# Patient Record
Sex: Male | Born: 1955 | Race: White | Hispanic: No | Marital: Married | Smoking: Former smoker
Health system: Southern US, Community
[De-identification: ages and names within clinical notes are randomized; demographics above are authoritative.]

## PROBLEM LIST (undated history)

## (undated) DIAGNOSIS — N400 Enlarged prostate without lower urinary tract symptoms: Secondary | ICD-10-CM

## (undated) DIAGNOSIS — I10 Essential (primary) hypertension: Secondary | ICD-10-CM

## (undated) DIAGNOSIS — I2510 Atherosclerotic heart disease of native coronary artery without angina pectoris: Secondary | ICD-10-CM

## (undated) DIAGNOSIS — E7800 Pure hypercholesterolemia, unspecified: Secondary | ICD-10-CM

## (undated) HISTORY — DX: Benign prostatic hyperplasia without lower urinary tract symptoms: N40.0

## (undated) HISTORY — DX: Atherosclerotic heart disease of native coronary artery without angina pectoris: I25.10

---
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
## 2000-06-27 ENCOUNTER — Emergency Department (HOSPITAL_COMMUNITY): Admission: EM | Admit: 2000-06-27 | Discharge: 2000-06-27 | Payer: Self-pay | Admitting: Emergency Medicine

## 2000-06-27 ENCOUNTER — Encounter: Payer: Self-pay | Admitting: Emergency Medicine

## 2000-08-16 ENCOUNTER — Ambulatory Visit (HOSPITAL_BASED_OUTPATIENT_CLINIC_OR_DEPARTMENT_OTHER): Admission: RE | Admit: 2000-08-16 | Discharge: 2000-08-16 | Payer: Self-pay | Admitting: Pulmonary Disease

## 2000-09-10 ENCOUNTER — Encounter: Admission: RE | Admit: 2000-09-10 | Discharge: 2000-12-09 | Payer: Self-pay | Admitting: Pulmonary Disease

## 2009-06-26 ENCOUNTER — Emergency Department (HOSPITAL_COMMUNITY): Admission: EM | Admit: 2009-06-26 | Discharge: 2009-06-26 | Payer: Self-pay | Admitting: Emergency Medicine

## 2010-05-29 ENCOUNTER — Encounter (INDEPENDENT_AMBULATORY_CARE_PROVIDER_SITE_OTHER): Payer: Self-pay | Admitting: *Deleted

## 2010-07-25 ENCOUNTER — Encounter (INDEPENDENT_AMBULATORY_CARE_PROVIDER_SITE_OTHER): Payer: Self-pay | Admitting: *Deleted

## 2010-07-26 ENCOUNTER — Ambulatory Visit: Payer: Self-pay | Admitting: Gastroenterology

## 2010-08-09 ENCOUNTER — Ambulatory Visit: Payer: Self-pay | Admitting: Gastroenterology

## 2010-08-11 ENCOUNTER — Encounter: Payer: Self-pay | Admitting: Gastroenterology

## 2010-11-02 NOTE — Letter (Signed)
Summary: Results Letter  [facility]  [address] [place], [place] [address]   Phone: [phone]  Fax: [phone]        August 11, 2010 MRN: [id number]    [facility] [address] [place], [place]  [address]    Dear Mr. Feldstein,   At least one of the polyps removed during your recent procedure was proven to be adenomatous.  These are pre-cancerous polyps that may have grown into cancers if they had not been removed.  Based on current nationally recognized surveillance guidelines, I recommend that you have a repeat colonoscopy in 5 years.  We will therefore put your information in our reminder system and will contact you in 5 years to schedule a repeat procedure.  Please call if you have any questions or concerns.       Sincerely,  Rachael Fee MD  This letter has been electronically signed by your physician.  Appended Document: Results Letter letter mailed

## 2010-11-02 NOTE — Letter (Signed)
Summary: [facility] Instructions  [facility]  [address] [place], [place] [address]   Phone: [phone]  Fax: [phone]       Manuel Williams    [birth year]    MRN: [id number]        Procedure Day Dorna Bloom:  [facility]  08/09/10     Arrival Time:  9:00AM     Procedure Time:  10:00AM     Location of Procedure:                    _X _  [facility] (4th Floor)                       PREPARATION FOR COLONOSCOPY WITH MOVIPREP   Starting 5 days prior to your procedure 08/04/10 do not eat nuts, seeds, popcorn, corn, beans, peas,  salads, or any raw vegetables.  Do not take any fiber supplements (e.g. Metamucil, Citrucel, and Benefiber).  THE DAY BEFORE YOUR PROCEDURE         DATE: 08/08/10   DAY: TUESDAY  1.  Drink clear liquids the entire day-NO SOLID FOOD  2.  Do not drink anything colored red or purple.  Avoid juices with pulp.  No orange juice.  3.  Drink at least 64 oz. (8 glasses) of fluid/clear liquids during the day to prevent dehydration and help the prep work efficiently.  CLEAR LIQUIDS INCLUDE: Water Jello Ice Popsicles Tea (sugar ok, no milk/cream) Powdered fruit flavored drinks Coffee (sugar ok, no milk/cream) Gatorade Juice: apple, white grape, white cranberry  Lemonade Clear bullion, consomm, broth Carbonated beverages (any kind) Strained chicken noodle soup Hard Candy                             4.  In the morning, mix first dose of MoviPrep solution:    Empty 1 Pouch A and 1 Pouch B into the disposable container    Add lukewarm drinking water to the top line of the container. Mix to dissolve    Refrigerate (mixed solution should be used within 24 hrs)  5.  Begin drinking the prep at 5:00 p.m. The MoviPrep container is divided by 4 marks.   Every 15 minutes drink the solution down to the next mark (approximately 8 oz) until the full liter is complete.   6.  Follow completed prep with 16 oz of clear liquid of your choice  (Nothing red or purple).  Continue to drink clear liquids until bedtime.  7.  Before going to bed, mix second dose of MoviPrep solution:    Empty 1 Pouch A and 1 Pouch B into the disposable container    Add lukewarm drinking water to the top line of the container. Mix to dissolve    Refrigerate  THE DAY OF YOUR PROCEDURE      DATE: 08/09/10   DAY: WEDNESDAY  Beginning at 5:00AM (5 hours before procedure):         1. Every 15 minutes, drink the solution down to the next mark (approx 8 oz) until the full liter is complete.  2. Follow completed prep with 16 oz. of clear liquid of your choice.    3. You may drink clear liquids until 8:00AM (2 HOURS BEFORE PROCEDURE).   MEDICATION INSTRUCTIONS  Unless otherwise instructed, you should take regular prescription medications with a small sip of water   as early as possible the  morning of your procedure.  Additional medication instructions: Hold Benicar/HCT the morning of procedure.         OTHER INSTRUCTIONS  You will need a responsible adult at least 56 years of age to accompany you and drive you home.   This person must remain in the waiting room during your procedure.  Wear loose fitting clothing that is easily removed.  Leave jewelry and other valuables at home.  However, you may wish to bring a book to read or  an iPod/MP3 player to listen to music as you wait for your procedure to start.  Remove all body piercing jewelry and leave at home.  Total time from sign-in until discharge is approximately 2-3 hours.  You should go home directly after your procedure and rest.  You can resume normal activities the  day after your procedure.  The day of your procedure you should not:   Drive   Make legal decisions   Operate machinery   Drink alcohol   Return to work  You will receive specific instructions about eating, activities and medications before you leave.    The above instructions have been reviewed and  explained to me by   Wyona Almas RN  July 26, 2010 4:10 PM     I fully understand and can verbalize these instructions _____________________________ Date _________

## 2010-11-02 NOTE — Letter (Signed)
Summary: Previsit letter  [facility]  [address] [place], [place] [address]   Phone: [phone]  Fax: [phone]       05/29/2010 MRN: [id number]  [facility] [address] [place], [place]  [address]  Dear Manuel Williams,  Welcome to the Gastroenterology Division at [facility].    You are scheduled to see a nurse for your pre-procedure visit on June 14, 2010 at 1:00pm on the 3rd floor at [facility], [address]  We ask that you try to arrive at our office 15 minutes prior to your appointment time to allow for check-in.  Your nurse visit will consist of discussing your medical and surgical history, your immediate family medical history, and your medications.    Please bring a complete list of all your medications or, if you prefer, bring the medication bottles and we will list them.  We will need to be aware of both prescribed and over the counter drugs.  We will need to know exact dosage information as well.  If you are on blood thinners (Coumadin, Plavix, Aggrenox, Ticlid, etc.) please call our office today/prior to your appointment, as we need to consult with your physician about holding your medication.   Please be prepared to read and sign documents such as consent forms, a financial agreement, and acknowledgement forms.  If necessary, and with your consent, a friend or relative is welcome to sit-in on the nurse visit with you.  Please bring your insurance card so that we may make a copy of it.  If your insurance requires a referral to see a specialist, please bring your referral form from your primary care physician.  No co-pay is required for this nurse visit.     If you cannot keep your appointment, please call [phone] to cancel or reschedule prior to your appointment date.  This allows [place] the opportunity to schedule an appointment for another patient in need of care.    Thank you for choosing [facility] for your  medical needs.  We appreciate the opportunity to care for you.  Please visit [place] at our website  to learn more about our practice.                     Sincerely.                                                                                                                   The Gastroenterology Division

## 2010-11-02 NOTE — Miscellaneous (Signed)
Summary: [facility] Previsit/prep  Clinical Lists Changes  Medications: Added new medication of MOVIPREP 100 GM  SOLR (PEG-KCL-NACL-NASULF-NA ASC-C) As per prep instructions. - Signed Rx of MOVIPREP 100 GM  SOLR (PEG-KCL-NACL-NASULF-NA ASC-C) As per prep instructions.;  #1 x 0;  Signed;  Entered by: Wyona Almas RN;  Authorized by: Rachael Fee MD;  Method used: Electronically to [facility]. [place]. [phone]*, [address], [place], [place], [place]  [address], Ph: [phone] or [phone], Fax: [phone] Observations: Added new observation of NKA: T (07/26/2010 15:33)    Prescriptions: MOVIPREP 100 GM  SOLR (PEG-KCL-NACL-NASULF-NA ASC-C) As per prep instructions.  #1 x 0   Entered by:   Wyona Almas RN   Authorized by:   Rachael Fee MD   Signed by:   Wyona Almas RN on 07/26/2010   Method used:   Electronically to        [facility]. [address] [phone]* (retail)       [address]       [place], [place]  [address]       Ph: [phone] or [phone]       Fax: [phone]   RxID:   [id number]

## 2010-11-02 NOTE — Procedures (Signed)
Summary: Colonoscopy  Patient: Manuel Williams Note: All result statuses are Final unless otherwise noted.  Tests: (1) Colonoscopy (COL)   COL Colonoscopy           DONE      [facility]     [address]     [place], [place]  [address]           COLONOSCOPY PROCEDURE REPORT           PATIENT:  [name], [name]  MR#:  [id number]     BIRTHDATE:  30-Jun-1956, 54 yrs. old  GENDER:  male     ENDOSCOPIST:  Rachael Fee, MD     REF. BY:  Tomi Bamberger, NP     PROCEDURE DATE:  08/09/2010     PROCEDURE:  Colonoscopy with biopsy and snare polypectomy     ASA CLASS:  Class II     INDICATIONS:  Routine Risk Screening     MEDICATIONS:   Fentanyl 50 mcg IV, Versed 9 mg IV           DESCRIPTION OF PROCEDURE:   After the risks benefits and     alternatives of the procedure were thoroughly explained, informed     consent was obtained.  Digital rectal exam was performed and     revealed no rectal masses.   The LB 180AL [id number] endoscope was     introduced through the anus and advanced to the cecum, which was     identified by both the appendix and ileocecal valve, without     limitations.  The quality of the prep was adequate, using     MoviPrep.  The instrument was then slowly withdrawn as the colon     was fully examined.     <<PROCEDUREIMAGES>>           FINDINGS:  Three sessile polyps were found, all were removed and     all were sent to pathology (jar 1). These ranged in size from 2mm     to 5mm across, were located in ascending and rectum segments. Two     were removed with cold snare, one with forceps (see image4 and     image7).  Mild diverticulosis was found in the sigmoid to     descending colon segments (see image5).  This was otherwise a     normal examination of the colon (see image6, image2, and image1).     Retroflexed views in the rectum revealed no abnormalities.    The     scope was then withdrawn from the patient and the procedure     completed.        COMPLICATIONS:  None     ENDOSCOPIC IMPRESSION:     1) Three subcentimeter polyps, all removed and all sent to     pathology     2) Mild diverticulosis in the sigmoid to descending colon     segments     3) Otherwise normal examination           RECOMMENDATIONS:     1) If the polyp(s) removed today are proven to be adenomatous     (pre-cancerous) polyps, you will need a colonoscopy in 3-5 years.     Otherwise you should continue to follow colorectal cancer     screening guidelines for "routine risk" patients with a     colonoscopy in 10 years.     2) You will receive a letter within 1-2 weeks with the  results     of your biopsy as well as final recommendations. Please call my     office if you have not received a letter after 3 weeks.           ______________________________     Rachael Fee, MD           n.     eSIGNED:   Rachael Fee at 08/09/2010 10:33 AM           Karleen Dolphin, [phone]  Note: An exclamation mark (!) indicates a result that was not dispersed into the flowsheet. Document Creation Date: 08/09/2010 10:33 AM _______________________________________________________________________  (1) Order result status: Final Collection or observation date-time: 08/09/2010 10:28 Requested date-time:  Receipt date-time:  Reported date-time:  Referring Physician:   Ordering Physician: Rob Bunting [phone]) Specimen Source:  Source: Launa Grill Order Number: [id number] Lab site:   Appended Document: Colonoscopy     Procedures Next Due Date:    Colonoscopy: 08/2015

## 2011-09-09 ENCOUNTER — Emergency Department (HOSPITAL_COMMUNITY): Payer: 59

## 2011-09-09 ENCOUNTER — Encounter: Payer: Self-pay | Admitting: *Deleted

## 2011-09-09 ENCOUNTER — Emergency Department (HOSPITAL_COMMUNITY)
Admission: EM | Admit: 2011-09-09 | Discharge: 2011-09-09 | Disposition: A | Discharge status: Home / Self Care | Payer: 59 | Attending: Emergency Medicine | Admitting: Emergency Medicine

## 2011-09-09 DIAGNOSIS — I10 Essential (primary) hypertension: Secondary | ICD-10-CM | POA: Insufficient documentation

## 2011-09-09 DIAGNOSIS — J111 Influenza due to unidentified influenza virus with other respiratory manifestations: Secondary | ICD-10-CM | POA: Insufficient documentation

## 2011-09-09 DIAGNOSIS — F17200 Nicotine dependence, unspecified, uncomplicated: Secondary | ICD-10-CM | POA: Insufficient documentation

## 2011-09-09 HISTORY — DX: Essential (primary) hypertension: I10

## 2011-09-09 MED ORDER — OSELTAMIVIR PHOSPHATE 75 MG PO CAPS: 75.0000 mg | ORAL_CAPSULE | Freq: Two times a day (BID) | ORAL | Status: AC

## 2011-09-09 NOTE — ED Notes (Signed)
Pt c/o cough and congestion since Friday. Also c/o headache. Denies fever.

## 2011-09-10 NOTE — ED Provider Notes (Signed)
History     CSN: [id number] Arrival date & time: 09/09/2011 10:19 AM   First MD Initiated Contact with Patient 09/09/11 1040      Chief Complaint  Patient presents with  . Cough    (Consider location/radiation/quality/duration/timing/severity/associated sxs/prior treatment) Patient is a 55 y.o. male presenting with cough. The history is provided by the patient.  Cough This is a new problem. The current episode started 2 days ago. The problem occurs every few minutes. The problem has been gradually worsening. The cough is non-productive. There has been no fever. Associated symptoms include chills, sweats, headaches and myalgias. Pertinent negatives include no chest pain, no sore throat, no shortness of breath and no wheezing. He has tried cough syrup (also tried Tylenol cold without relief) for the symptoms. He is a smoker. His past medical history does not include pneumonia or asthma.    Past Medical History  Diagnosis Date  . Hypertension     History reviewed. No pertinent past surgical history.  History reviewed. No pertinent family history.  History  Substance Use Topics  . Smoking status: Current Everyday Smoker -- 0.5 packs/day  . Smokeless tobacco: Not on file  . Alcohol Use: No      Review of Systems  Constitutional: Positive for chills. Negative for fever.  HENT: Negative for congestion, sore throat and neck pain.   Eyes: Negative.   Respiratory: Positive for cough. Negative for chest tightness, shortness of breath and wheezing.   Cardiovascular: Negative for chest pain.  Gastrointestinal: Negative for nausea and abdominal pain.  Genitourinary: Negative.   Musculoskeletal: Positive for myalgias. Negative for joint swelling and arthralgias.  Skin: Negative.  Negative for rash and wound.  Neurological: Positive for headaches. Negative for dizziness, weakness, light-headedness and numbness.  Hematological: Negative.   Psychiatric/Behavioral: Negative.      Allergies  Review of patient's allergies indicates no known allergies.  Home Medications   Current Outpatient Rx  Name Route Sig Dispense Refill  . GUAIFENESIN 100 MG/5ML PO SOLN Oral Take 15 mLs by mouth every 4 (four) hours as needed. Cough     . OLMESARTAN MEDOXOMIL-HCTZ 20-12.5 MG PO TABS Oral Take 1 tablet by mouth daily.      Marland Kitchen PHENYLEPHRINE-DM-GG-APAP 5-10-200-325 MG/15ML PO LIQD Oral Take 15 mLs by mouth daily as needed. Cough     . OSELTAMIVIR PHOSPHATE 75 MG PO CAPS Oral Take 1 capsule (75 mg total) by mouth 2 (two) times daily. 10 capsule 0    BP 145/61  Pulse 86  Temp(Src) 99.1 F (37.3 C) (Oral)  Resp 18  Ht 6' (1.829 m)  Wt 255 lb (115.667 kg)  BMI 34.58 kg/m2  SpO2 98%  Physical Exam  Nursing note and vitals reviewed. Constitutional: He is oriented to person, place, and time. He appears well-developed and well-nourished.  HENT:  Head: Normocephalic and atraumatic.  Eyes: Conjunctivae are normal.  Neck: Normal range of motion. Neck supple.  Cardiovascular: Normal rate, regular rhythm, normal heart sounds and intact distal pulses.   Pulmonary/Chest: Effort normal and breath sounds normal. He has no wheezes. He has no rales. He exhibits no tenderness.  Abdominal: Soft. Bowel sounds are normal. There is no tenderness.  Musculoskeletal: Normal range of motion.  Neurological: He is alert and oriented to person, place, and time.  Skin: Skin is warm and dry.  Psychiatric: He has a normal mood and affect.    ED Course  Procedures (including critical care time)  Labs Reviewed - No data to  display Dg Chest 2 View  09/09/2011  *RADIOLOGY REPORT*  Clinical Data: Cough and chest congestion.  CHEST - 2 VIEW  Comparison:  None.  Findings:  The heart size and mediastinal contours are within normal limits.  Both lungs are clear.  The visualized skeletal structures are unremarkable.  IMPRESSION: No active cardiopulmonary disease.  Original Report Authenticated By: Danae Orleans, M.D.     1. Influenza       MDM  The patient appears reasonably screened and/or stabilized for discharge and I doubt any other medical condition or other [facility] requiring further screening, evaluation, or treatment in the ED at this time prior to discharge.         Candis Musa, PA 09/10/11 2128

## 2011-09-11 NOTE — ED Provider Notes (Signed)
Medical screening examination/treatment/procedure(s) were performed by non-physician practitioner and as supervising physician I was immediately available for consultation/collaboration.   Shelda Jakes, MD 09/11/11 [phone]

## 2012-03-01 ENCOUNTER — Encounter (HOSPITAL_COMMUNITY): Payer: Self-pay | Admitting: Emergency Medicine

## 2012-03-01 ENCOUNTER — Emergency Department (HOSPITAL_COMMUNITY)
Admission: EM | Admit: 2012-03-01 | Discharge: 2012-03-01 | Disposition: A | Discharge status: Home / Self Care | Payer: 59 | Attending: Emergency Medicine | Admitting: Emergency Medicine

## 2012-03-01 DIAGNOSIS — S30860A Insect bite (nonvenomous) of lower back and pelvis, initial encounter: Secondary | ICD-10-CM | POA: Insufficient documentation

## 2012-03-01 DIAGNOSIS — I10 Essential (primary) hypertension: Secondary | ICD-10-CM | POA: Insufficient documentation

## 2012-03-01 DIAGNOSIS — F17200 Nicotine dependence, unspecified, uncomplicated: Secondary | ICD-10-CM | POA: Insufficient documentation

## 2012-03-01 DIAGNOSIS — W57XXXA Bitten or stung by nonvenomous insect and other nonvenomous arthropods, initial encounter: Secondary | ICD-10-CM | POA: Insufficient documentation

## 2012-03-01 MED ORDER — DOXYCYCLINE HYCLATE 100 MG PO CAPS: 100.0000 mg | ORAL_CAPSULE | Freq: Two times a day (BID) | ORAL | Status: AC

## 2012-03-01 NOTE — ED Notes (Signed)
Pt states he took a tick off of his rt lower back this am and wants to have it checked out.

## 2012-03-01 NOTE — Discharge Instructions (Signed)
Wood Tick Bite Ticks are insects that attach themselves to the skin. Most tick bites are harmless, but sometimes ticks carry diseases that can make a person quite ill. The chance of getting ill depends on:  The kind of tick that bites you.   Time of year.   How long the tick is attached.   Geographic location.  Wood ticks are also called dog ticks. They are generally black. They can have white markings. They live in shrubs and grassy areas. They are larger than deer ticks. Wood ticks are about the size of a watermelon seed. They have a hard body. The most common places for ticks to attach themselves are the scalp, neck, armpits, waist, and groin. Wood ticks may stay attached for up to 2 weeks. TICKS MUST BE REMOVED AS SOON AS POSSIBLE TO HELP PREVENT DISEASES CAUSED BY TICK BITES.  TO REMOVE A TICK: 1. If available, put on latex gloves before trying to remove a tick.  2. Grasp the tick as close to the skin as possible, with curved forceps, fine tweezers or a special tick removal tool.  3. Pull gently with steady pressure until the tick lets go. Do not twist the tick or jerk it suddenly. This may break off the tick's head or mouth parts.  4. Do not crush the tick's body. This could force disease-carrying fluids from the tick into your body.  5. After the tick is removed, wash the bite area and your hands with soap and water or other disinfectant.  6. Apply a small amount of antiseptic cream or ointment to the bite site.  7. Wash and disinfect any instruments that were used.  8. Save the tick in a jar or plastic bag for later identification. Preserve the tick with a bit of alcohol or put it in the freezer.  9. Do not apply a hot match, petroleum jelly, or fingernail polish to the tick. This does not work and may increase the chances of disease from the tick bite.  YOU MAY NEED TO SEE YOUR CAREGIVER FOR A TETANUS SHOT NOW IF:  You have no idea when you had the last one.   You have never had a  tetanus shot before.  If you need a tetanus shot, and you decide not to get one, there is a rare chance of getting tetanus. Sickness from tetanus can be serious. If you get a tetanus shot, your arm may swell, get red and warm to the touch at the shot site. This is common and not a problem. PREVENTION  Wear protective clothing. Long sleeves and pants are best.   Wear white clothes to see ticks more easily   Tuck your pant legs into your socks.   If walking on trail, stay in the middle of the trail to avoid brushing against bushes.   Put insect repellent on all exposed skin and along boot tops, pant legs and sleeve cuffs   Check clothing, hair and skin repeatedly and before coming inside.   Brush off any ticks that are not attached.  SEEK MEDICAL CARE IF:   You cannot remove a tick or part of the tick that is left in the skin.   Unexplained fever.   Redness and swelling in the area of the tick bite.   Tender, swollen lymph glands.   Diarrhea.   Weight loss.   Cough.   Fatigue.   Muscle, joint or bone pain.   Belly pain.   Headache.   Rash.    SEEK IMMEDIATE MEDICAL CARE IF:   You develop an oral temperature above 102 F (38.9 C).   You are having trouble walking or moving your legs.   Numbness in the legs.   Shortness of breath.   Confusion.   Repeated vomiting.  Document Released: 09/14/2000 Document Revised: 09/06/2011 Document Reviewed: 08/23/2008 [employer] Patient Information 2012 [employer]. 

## 2012-03-01 NOTE — ED Notes (Signed)
Pt presents with tick bite on rt lower back. Site is raised, red with small raised "bumps" surrounding the bite site.  Tick was removed about an hour ago. Pt denies known fever, joint and/or body aches.

## 2012-03-02 NOTE — ED Provider Notes (Signed)
History     CSN: [id number]  Arrival date & time 03/01/12  1136   First MD Initiated Contact with Patient 03/01/12 1159      Chief Complaint  Patient presents with  . Tick Removal    (Consider location/radiation/quality/duration/timing/severity/associated sxs/prior treatment) HPI Comments: Manuel Williams took a tick off his right flank which had been present for at least the past 2 days,  At first believing it to be a mole.  It was somewhat engorged when he removed it today. He has a raised,  Reddened nodule along with several surrounding raised nodules which are itchy and becoming increasing reddened with spreading to the surrounding skin.  He denies any other rash,  Also denies fever, headache, nausea,  myalgias or joint pain and swelling.    The history is provided by the patient.    Past Medical History  Diagnosis Date  . Hypertension     History reviewed. No pertinent past surgical history.  History reviewed. No pertinent family history.  History  Substance Use Topics  . Smoking status: Current Everyday Smoker -- 0.5 packs/day    Types: Cigarettes  . Smokeless tobacco: Not on file  . Alcohol Use: No      Review of Systems  Constitutional: Negative for fever and chills.  HENT: Negative for facial swelling.   Respiratory: Negative for shortness of breath and wheezing.   Skin: Positive for rash and wound.  Neurological: Negative for numbness.    Allergies  Review of patient's allergies indicates no known allergies.  Home Medications   Current Outpatient Rx  Name Route Sig Dispense Refill  . DOXYCYCLINE HYCLATE 100 MG PO CAPS Oral Take 1 capsule (100 mg total) by mouth 2 (two) times daily. 20 capsule 0  . GUAIFENESIN 100 MG/5ML PO SOLN Oral Take 15 mLs by mouth every 4 (four) hours as needed. Cough     . OLMESARTAN MEDOXOMIL-HCTZ 20-12.5 MG PO TABS Oral Take 1 tablet by mouth daily.      Marland Kitchen PHENYLEPHRINE-DM-GG-APAP 5-10-200-325 MG/15ML PO LIQD Oral Take 15 mLs  by mouth daily as needed. Cough       BP 122/61  Pulse 65  Temp(Src) 97.5 F (36.4 C) (Oral)  Resp 20  Ht 6\' 1"  (1.854 m)  Wt 258 lb (117.028 kg)  BMI 34.04 kg/m2  SpO2 98%  Physical Exam  Constitutional: He appears well-developed and well-nourished. No distress.  HENT:  Head: Normocephalic.  Neck: Neck supple.  Cardiovascular: Normal rate.   Pulmonary/Chest: Effort normal. He has no wheezes.  Musculoskeletal: Normal range of motion. He exhibits no edema.  Skin: Rash noted. Rash is papular. There is erythema.       One central raised papule on right flank,  approx 0.4 cm and indurated without fluctuance or drainage,  3 satellite papules 1/2 the size of the central lesion with erythema surrounding the entire patch of lesions,  No central clearing of erythema.    ED Course  Procedures (including critical care time)  Labs Reviewed - No data to display No results found.   1. Tick bite of back       MDM  Not classic bulls eye lesion,  But with significant tick exposure,  Pt covered with doxycycline x 10 days.  Encouraged recheck by pcp or return here if not improving,  Or if sx progress beyond the next 48 hours.  Recommended benadryl for itch.        Burgess Amor, [place] 03/02/12 2203

## 2012-03-03 NOTE — ED Provider Notes (Signed)
Medical screening examination/treatment/procedure(s) were performed by non-physician practitioner and as supervising physician I was immediately available for consultation/collaboration.   Dayton Bailiff, MD 03/03/12 [phone]

## 2014-12-05 ENCOUNTER — Emergency Department (HOSPITAL_COMMUNITY)
Admission: EM | Admit: 2014-12-05 | Discharge: 2014-12-05 | Disposition: A | Discharge status: Home / Self Care | Payer: 59 | Attending: Emergency Medicine | Admitting: Emergency Medicine

## 2014-12-05 ENCOUNTER — Encounter (HOSPITAL_COMMUNITY): Payer: Self-pay | Admitting: Emergency Medicine

## 2014-12-05 ENCOUNTER — Emergency Department (HOSPITAL_COMMUNITY): Payer: 59

## 2014-12-05 DIAGNOSIS — Y939 Activity, unspecified: Secondary | ICD-10-CM | POA: Insufficient documentation

## 2014-12-05 DIAGNOSIS — Z7982 Long term (current) use of aspirin: Secondary | ICD-10-CM | POA: Insufficient documentation

## 2014-12-05 DIAGNOSIS — Y929 Unspecified place or not applicable: Secondary | ICD-10-CM | POA: Insufficient documentation

## 2014-12-05 DIAGNOSIS — Z79899 Other long term (current) drug therapy: Secondary | ICD-10-CM | POA: Insufficient documentation

## 2014-12-05 DIAGNOSIS — S29012A Strain of muscle and tendon of back wall of thorax, initial encounter: Secondary | ICD-10-CM

## 2014-12-05 DIAGNOSIS — N400 Enlarged prostate without lower urinary tract symptoms: Secondary | ICD-10-CM | POA: Insufficient documentation

## 2014-12-05 DIAGNOSIS — X58XXXA Exposure to other specified factors, initial encounter: Secondary | ICD-10-CM | POA: Diagnosis not present

## 2014-12-05 DIAGNOSIS — Y999 Unspecified external cause status: Secondary | ICD-10-CM | POA: Insufficient documentation

## 2014-12-05 DIAGNOSIS — I10 Essential (primary) hypertension: Secondary | ICD-10-CM | POA: Diagnosis not present

## 2014-12-05 DIAGNOSIS — M79662 Pain in left lower leg: Secondary | ICD-10-CM | POA: Diagnosis not present

## 2014-12-05 DIAGNOSIS — M25512 Pain in left shoulder: Secondary | ICD-10-CM | POA: Diagnosis present

## 2014-12-05 DIAGNOSIS — Z720 Tobacco use: Secondary | ICD-10-CM | POA: Diagnosis not present

## 2014-12-05 HISTORY — DX: Benign prostatic hyperplasia without lower urinary tract symptoms: N40.0

## 2014-12-05 LAB — CBC WITH DIFFERENTIAL/PLATELET
Basophils Absolute: 0 10*3/uL (ref 0.0–0.1)
Basophils Relative: 0 % (ref 0–1)
Eosinophils Absolute: 0.4 10*3/uL (ref 0.0–0.7)
Eosinophils Relative: 6 % — ABNORMAL HIGH (ref 0–5)
HCT: 49.1 % (ref 39.0–52.0)
Hemoglobin: 16.4 g/dL (ref 13.0–17.0)
Lymphocytes Relative: 27 % (ref 12–46)
Lymphs Abs: 1.9 10*3/uL (ref 0.7–4.0)
MCH: 31 pg (ref 26.0–34.0)
MCHC: 33.4 g/dL (ref 30.0–36.0)
MCV: 92.8 fL (ref 78.0–100.0)
MONOS PCT: 8 % (ref 3–12)
Monocytes Absolute: 0.6 10*3/uL (ref 0.1–1.0)
NEUTROS ABS: 4.1 10*3/uL (ref 1.7–7.7)
NEUTROS PCT: 59 % (ref 43–77)
Platelets: 154 10*3/uL (ref 150–400)
RBC: 5.29 MIL/uL (ref 4.22–5.81)
RDW: 12.5 % (ref 11.5–15.5)
WBC: 7 10*3/uL (ref 4.0–10.5)

## 2014-12-05 LAB — BASIC METABOLIC PANEL
Anion gap: 6 (ref 5–15)
BUN: 20 mg/dL (ref 6–23)
CO2: 26 mmol/L (ref 19–32)
Calcium: 9.3 mg/dL (ref 8.4–10.5)
Chloride: 106 mmol/L (ref 96–112)
Creatinine, Ser: 0.88 mg/dL (ref 0.50–1.35)
GFR calc Af Amer: >90 mL/min (ref >90)
GFR calc non Af Amer: >90 mL/min (ref >90)
GLUCOSE: 124 mg/dL — AB (ref 70–99)
Potassium: 4.2 mmol/L (ref 3.5–5.1)
SODIUM: 138 mmol/L (ref 135–145)

## 2014-12-05 MED ORDER — PREDNISONE 10 MG PO TABS
ORAL_TABLET | ORAL | Status: DC
Start: 2014-12-05 — End: 2015-10-25

## 2014-12-05 MED ORDER — CYCLOBENZAPRINE HCL 5 MG PO TABS: 5.0000 mg | ORAL_TABLET | Freq: Three times a day (TID) | ORAL | Status: DC | PRN

## 2014-12-05 NOTE — ED Notes (Signed)
PT c/o bilateral shoulder pain and stiffness x2 weeks with no injury. PT also c/o left lower extremity swelling that started yesterday. PT denies any SOB or chest pain.

## 2014-12-05 NOTE — Discharge Instructions (Signed)
Muscle Strain A muscle strain is an injury that occurs when a muscle is stretched beyond its normal length. Usually a small number of muscle fibers are torn when this happens. Muscle strain is rated in degrees. First-degree strains have the least amount of muscle fiber tearing and pain. Second-degree and third-degree strains have increasingly more tearing and pain.  Usually, recovery from muscle strain takes 1-2 weeks. Complete healing takes 5-6 weeks.  CAUSES  Muscle strain happens when a sudden, violent force placed on a muscle stretches it too far. This may occur with lifting, sports, or a fall.  RISK FACTORS Muscle strain is especially common in athletes.  SIGNS AND SYMPTOMS At the site of the muscle strain, there may be:  Pain.  Bruising.  Swelling.  Difficulty using the muscle due to pain or lack of normal function. DIAGNOSIS  Your health care provider will perform a physical exam and ask about your medical history. TREATMENT  Often, the best treatment for a muscle strain is resting, icing, and applying cold compresses to the injured area.  HOME CARE INSTRUCTIONS   Use the PRICE method of treatment to promote muscle healing during the first 2-3 days after your injury. The PRICE method involves:  Protecting the muscle from being injured again.  Restricting your activity and resting the injured body part.  Icing your injury. To do this, put ice in a plastic bag. Place a towel between your skin and the bag. Then, apply the ice and leave it on from 15-20 minutes each hour. After the third day, switch to moist heat packs.  Apply compression to the injured area with a splint or elastic bandage. Be careful not to wrap it too tightly. This may interfere with blood circulation or increase swelling.  Elevate the injured body part above the level of your heart as often as you can.  Only take over-the-counter or prescription medicines for pain, discomfort, or fever as directed by your  health care provider.  Warming up prior to exercise helps to prevent future muscle strains. SEEK MEDICAL CARE IF:   You have increasing pain or swelling in the injured area.  You have numbness, tingling, or a significant loss of strength in the injured area. MAKE SURE YOU:   Understand these instructions.  Will watch your condition.  Will get help right away if you are not doing well or get worse. Document Released: 09/17/2005 Document Revised: 07/08/2013 Document Reviewed: 04/16/2013 [facility] Patient Information 2015 [place], [place]. This information is not intended to replace advice given to you by your health care provider. Make sure you discuss any questions you have with your health care provider.  Heat Therapy Heat therapy can help make painful, stiff muscles and joints feel better. Do not use heat on new injuries. Wait at least 48 hours after an injury to use heat. Do not use heat when you have aches or pains right after an activity. If you still have pain 3 hours after stopping the activity, then you may use heat. HOME CARE Wet heat pack  Soak a clean towel in warm water. Squeeze out the extra water.  Put the warm, wet towel in a plastic bag.  Place a thin, dry towel between your skin and the bag.  Put the heat pack on the area for 5 minutes, and check your skin. Your skin may be pink, but it should not be red.  Leave the heat pack on the area for 15 to 30 minutes.  Repeat this every  2 to 4 hours while awake. Do not use heat while you are sleeping. Warm water bath  Fill a tub with warm water.  Place the affected body part in the tub.  Soak the area for 20 to 40 minutes.  Repeat as needed. Hot water bottle  Fill the water bottle half full with hot water.  Press out the extra air. Close the cap tightly.  Place a dry towel between your skin and the bottle.  Put the bottle on the area for 5 minutes, and check your skin. Your skin may be pink, but it should not  be red.  Leave the bottle on the area for 15 to 30 minutes.  Repeat this every 2 to 4 hours while awake. Electric heating pad  Place a dry towel between your skin and the heating pad.  Set the heating pad on low heat.  Put the heating pad on the area for 10 minutes, and check your skin. Your skin may be pink, but it should not be red.  Leave the heating pad on the area for 20 to 40 minutes.  Repeat this every 2 to 4 hours while awake.  Do not lie on the heating pad.  Do not fall asleep while using the heating pad.  Do not use the heating pad near water. GET HELP RIGHT AWAY IF:  You get blisters or red skin.  Your skin is puffy (swollen), or you lose feeling (numbness) in the affected area.  You have any new problems.  Your problems are getting worse.  You have any questions or concerns. If you have any problems, stop using heat therapy until you see your doctor. MAKE SURE YOU:  Understand these instructions.  Will watch your condition.  Will get help right away if you are not doing well or get worse. Document Released: 12/10/2011 Document Reviewed: 11/10/2013 [facility] Patient Information 2015 [place]. This information is not intended to replace advice given to you by your health care provider. Make sure you discuss any questions you have with your health care provider.   Use the medicines prescribed.  Use caution with flexeril as this muscle relaxer may make you sleepy.

## 2014-12-05 NOTE — ED Provider Notes (Signed)
CSN: [id number]     Arrival date & time 12/05/14  4270 History   First MD Initiated Contact with Patient 12/05/14 0930     Chief Complaint  Patient presents with  . Shoulder Pain     (Consider location/radiation/quality/duration/timing/severity/associated sxs/prior Treatment) The history is provided by the patient.   Manuel Williams is a 59 y.o. male with a h/o hypertension with 2 complaints, the first being a 2 week history of left lower calf mild "soreness" (denies pain) along with swelling of his calf noticed yesterday. He denies any injuries, also no fevers, rashes, changes in skin coloration, denies recent extended bed rest or inactivity.  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 He reports wearing new work boots that are not comfortable.  He also has bilateral upper back and shoulder blade pain which has also been present for the past 2 weeks and is triggered only by movement, again denies injury.  He denies sob, cough, chest pain.  His shoulder pain is resolved at rest and not pleuritic.  He has tried no medications or treatments for symptom relief.        Past Medical History  Diagnosis Date  . Hypertension   . Prostate enlargement    History reviewed. No pertinent past surgical history. No family history on file. History  Substance Use Topics  . Smoking status: Current Every Day Smoker -- 0.50 packs/day    Types: Cigarettes  . Smokeless tobacco: Not on file  . Alcohol Use: No    Review of Systems  Constitutional: Negative for fever and chills.  HENT: Negative.  Negative for congestion and sore throat.   Eyes: Negative.   Respiratory: Negative for cough, chest tightness, shortness of breath and wheezing.   Cardiovascular: Negative for chest pain.  Gastrointestinal: Negative for nausea and abdominal pain.  Genitourinary: Negative.   Musculoskeletal: Positive for arthralgias. Negative for joint swelling and neck pain.  Skin: Negative.  Negative for  rash and wound.  Neurological: Negative for dizziness, weakness, light-headedness, numbness and headaches.  Psychiatric/Behavioral: Negative.       Allergies  Review of patient's allergies indicates no known allergies.  Home Medications   Prior to Admission medications   Medication Sig Start Date End Date Taking? Authorizing Provider  aspirin EC 81 MG tablet Take 81 mg by mouth daily.   Yes Historical Provider, MD  fluticasone (CUTIVATE) 0.05 % cream Apply 1 application topically daily as needed. 10/13/14  Yes Historical Provider, MD  olmesartan-hydrochlorothiazide (BENICAR HCT) 20-12.5 MG per tablet Take 1 tablet by mouth daily.     Yes Historical Provider, MD  tamsulosin (FLOMAX) 0.4 MG CAPS capsule Take 1 capsule by mouth daily. 11/08/14  Yes Historical Provider, MD  cyclobenzaprine (FLEXERIL) 5 MG tablet Take 1 tablet (5 mg total) by mouth 3 (three) times daily as needed for muscle spasms. 12/05/14   Evalee Jefferson, PA-C  predniSONE (DELTASONE) 10 MG tablet 6, 5, 4, 3, 2 then 1 tablet by mouth daily for 6 days total. 12/05/14   Evalee Jefferson, PA-C   BP 130/81 mmHg  Pulse 59  Temp(Src) 97.6 F (36.4 C) (Oral)  Resp 18  Ht 6' (1.829 m)  Wt 265 lb (120.203 kg)  BMI 35.93 kg/m2  SpO2 100% Physical Exam  Constitutional: He appears well-developed and well-nourished.  HENT:  Head: Atraumatic.  Neck: Normal range of motion.  Cardiovascular: Normal rate, regular rhythm and normal heart sounds.   Pulses:      Dorsalis  pedis pulses are 2+ on the right side, and 2+ on the left side.  Pulses equal bilaterally.  Less than 2 sec cap refill in left toes.  No varicosities in lower left leg.  Pulmonary/Chest: Breath sounds normal. No respiratory distress. He has no wheezes. He has no rhonchi. He has no rales.  Musculoskeletal: He exhibits no tenderness.  Left calf appears slightly fuller than right visually, measured at 1 cm greater than the right.  No peripheral edema, pedal pulses intact and equal.    Negative Homan's TTP across bilateral upper back including scapulas,  No cervical ttp.  Neurological: He is alert. He has normal strength. He displays normal reflexes. No sensory deficit.  Skin: Skin is warm and dry. No rash noted. No pallor.  Psychiatric: He has a normal mood and affect.    ED Course  Procedures (including critical care time) Labs Review Labs Reviewed  BASIC METABOLIC PANEL - Abnormal; Notable for the following:    Glucose, Bld 124 (*)    All other components within normal limits  CBC WITH DIFFERENTIAL/PLATELET - Abnormal; Notable for the following:    Eosinophils Relative 6 (*)    All other components within normal limits    Imaging Review Dg Chest 2 View  12/05/2014   CLINICAL DATA:  BILATERAL SHOULDER PAIN X 2 WEEKS, NO INJURY. PATIENT STATES " BOTH SHOULDERS FEEL SORE WHEN HE RAISES HIS ARMS" SWELLING IN LEFT LEG IN CALF AREA, PATIENT NOTICED THE SWELLING LAST NIGHT. HISTORY OF HTN. CURRENT EVERYDAY SMOKER, 1 PK A DAY  EXAM: CHEST  2 VIEW  COMPARISON:  09/09/2011.  FINDINGS: Emphysema. Cardiopericardial silhouette within normal limits. Mediastinal contours normal. Trachea midline. No airspace disease or effusion. The extreme RIGHT costophrenic angle is excluded from view on the frontal. Thoracic spine DISH. Stable configuration of the thoracic spine. Mild exaggerated kyphosis.  IMPRESSION: Emphysema without acute cardiopulmonary disease.   Electronically Signed   By: Dereck Ligas M.D.   On: 12/05/2014 12:39   US Venous Img Lower Unilateral Left  12/05/2014   CLINICAL DATA:  Left lower extremity swelling and pain for 1 day with shortness of breath.  EXAM: LEFT LOWER EXTREMITY VENOUS DOPPLER ULTRASOUND  TECHNIQUE: Gray-scale sonography with graded compression, as well as color Doppler and duplex ultrasound were performed to evaluate the lower extremity deep venous systems from the level of the common femoral vein and including the common femoral, femoral, profunda femoral,  popliteal and calf veins including the posterior tibial, peroneal and gastrocnemius veins when visible. The superficial great saphenous vein was also interrogated. Spectral Doppler was utilized to evaluate flow at rest and with distal augmentation maneuvers in the common femoral, femoral and popliteal veins.  COMPARISON:  None.  FINDINGS: Contralateral Common Femoral Vein: Respiratory phasicity is normal and symmetric with the symptomatic side. No evidence of thrombus. Normal compressibility.  Common Femoral Vein: No evidence of thrombus. Normal compressibility, respiratory phasicity and response to augmentation.  Saphenofemoral Junction: No evidence of thrombus. Normal compressibility and flow on color Doppler imaging.  Profunda Femoral Vein: No evidence of thrombus. Normal compressibility and flow on color Doppler imaging.  Femoral Vein: No evidence of thrombus. Normal compressibility, respiratory phasicity and response to augmentation.  Popliteal Vein: No evidence of thrombus. Normal compressibility, respiratory phasicity and response to augmentation.  Calf Veins: No evidence of thrombus. Normal compressibility and flow on color Doppler imaging.  Superficial Great Saphenous Vein: No evidence of thrombus. Normal compressibility and flow on color Doppler imaging.  Venous  Reflux:  None.  Other Findings:  None.  IMPRESSION: No evidence of deep venous thrombosis.   Electronically Signed   By: Abigail Miyamoto M.D.   On: 12/05/2014 12:26     EKG Interpretation None      MDM   Final diagnoses:  Upper back strain, initial encounter  Calf pain, left    Patients labs and/or radiological studies were reviewed and considered during the medical decision making and disposition process.  Results were also discussed with patient.  Upper back pain reproducible with movement and palpation.  Suspect musculoskeletal strain.  [place] negative for dvt in [place].  Pt denies pain at this time.  Unclear source of sx, vascular  intact. No visible trauma, no h/o trauma. No varicosities.  No ankle edema.  Leg sx also possibly muscular strain related.  He was advised heat, elevation, flexeril, prednisone.  F/u with pcp in one week if sx persist.    Evalee Jefferson, PA-C 12/05/14 [place], DO 12/07/14 2337

## 2015-03-13 ENCOUNTER — Encounter (HOSPITAL_COMMUNITY): Payer: Self-pay

## 2015-03-13 ENCOUNTER — Emergency Department (HOSPITAL_COMMUNITY)
Admission: EM | Admit: 2015-03-13 | Discharge: 2015-03-13 | Disposition: A | Discharge status: Home / Self Care | Payer: 59 | Attending: Emergency Medicine | Admitting: Emergency Medicine

## 2015-03-13 DIAGNOSIS — H01001 Unspecified blepharitis right upper eyelid: Secondary | ICD-10-CM | POA: Insufficient documentation

## 2015-03-13 DIAGNOSIS — H578 Other specified disorders of eye and adnexa: Secondary | ICD-10-CM | POA: Diagnosis present

## 2015-03-13 DIAGNOSIS — H01003 Unspecified blepharitis right eye, unspecified eyelid: Secondary | ICD-10-CM

## 2015-03-13 DIAGNOSIS — Z7982 Long term (current) use of aspirin: Secondary | ICD-10-CM | POA: Diagnosis not present

## 2015-03-13 DIAGNOSIS — Z720 Tobacco use: Secondary | ICD-10-CM | POA: Insufficient documentation

## 2015-03-13 DIAGNOSIS — H01002 Unspecified blepharitis right lower eyelid: Secondary | ICD-10-CM | POA: Diagnosis not present

## 2015-03-13 DIAGNOSIS — H01004 Unspecified blepharitis left upper eyelid: Secondary | ICD-10-CM | POA: Insufficient documentation

## 2015-03-13 DIAGNOSIS — N400 Enlarged prostate without lower urinary tract symptoms: Secondary | ICD-10-CM | POA: Insufficient documentation

## 2015-03-13 DIAGNOSIS — H01006 Unspecified blepharitis left eye, unspecified eyelid: Secondary | ICD-10-CM

## 2015-03-13 DIAGNOSIS — H01005 Unspecified blepharitis left lower eyelid: Secondary | ICD-10-CM | POA: Insufficient documentation

## 2015-03-13 DIAGNOSIS — Z7952 Long term (current) use of systemic steroids: Secondary | ICD-10-CM | POA: Insufficient documentation

## 2015-03-13 DIAGNOSIS — I10 Essential (primary) hypertension: Secondary | ICD-10-CM | POA: Diagnosis not present

## 2015-03-13 DIAGNOSIS — Z79899 Other long term (current) drug therapy: Secondary | ICD-10-CM | POA: Insufficient documentation

## 2015-03-13 DIAGNOSIS — H5789 Other specified disorders of eye and adnexa: Secondary | ICD-10-CM

## 2015-03-13 MED ORDER — CLINDAMYCIN HCL 150 MG PO CAPS: 300.0000 mg | ORAL_CAPSULE | Freq: Three times a day (TID) | ORAL | Status: DC

## 2015-03-13 MED ORDER — FLUORESCEIN SODIUM 1 MG OP STRP
ORAL_STRIP | OPHTHALMIC | Status: AC
  Filled 2015-03-13: qty 1

## 2015-03-13 MED ORDER — FLUORESCEIN SODIUM 1 MG OP STRP
1.0000 | ORAL_STRIP | Freq: Once | OPHTHALMIC | Status: AC
  Administered 2015-03-13: 1 via OPHTHALMIC

## 2015-03-13 MED ORDER — FLUORESCEIN SODIUM 1 MG OP STRP
1.0000 | ORAL_STRIP | Freq: Once | OPHTHALMIC | Status: AC
  Administered 2015-03-13: 1 via OPHTHALMIC
  Filled 2015-03-13: qty 1

## 2015-03-13 MED ORDER — TETRACAINE HCL 0.5 % OP SOLN
2.0000 [drp] | Freq: Once | OPHTHALMIC | Status: AC
  Administered 2015-03-13: 2 [drp] via OPHTHALMIC
  Filled 2015-03-13: qty 2

## 2015-03-13 NOTE — ED Provider Notes (Signed)
CSN: [id number]     Arrival date & time 03/13/15  1451 History   None   This chart was scribed for non-physician practitioner, Jamse Mead, [place], working with Pamella Pert, MD by Terressa Koyanagi, ED Scribe. This patient was seen in room TR04C/TR04C and the patient's care was started at 5:35 PM.  Chief Complaint  Patient presents with  . Eye Problem   The history is provided by the patient. No language interpreter was used.   PCP: Dr. Delia Chimes  HPI Comments: Manuel Williams is a 59 y.o. male, with Hx of HTN, prostate enlargement and daily tobacco use (0.5 ppd),  who presents to the Emergency Department complaining of ongoing, atraumatic, constant left and right eye redness, itching, drainage, soreness and burning onset 3 weeks ago. Pt also complains of associated blurred vision; swelling of the eyes bilaterally; photophobia; and waking up with both eyes matted shut over the past couple of days. Pt reports he was seen by an opthamologist regarding the same approximately 2 weeks ago and was started on Neomycin, polymyxin B sulfate, and desxamethasone ointment. The meds afforded no relief, thus, pt contacted his opthamologist and was Rx the same meds in a suspension, again with no relief. Pt reports he has also been using OTC pink eye meds in conjunction with the meds prescribed by the opthamologist without relief. Pt reports he is UTD on his tetanus vaccine noting that his last tetanus shot was 2 years ago. Denied loss of vision, fever, chills, neck pain, neck stiffness, cough, nasal congestion, contact or eyeglass use, possibility of foreign object to the eyes recently, recent travels, chest pain, SOB, sore throat, difficulty breathing, dizziness, headaches.  PCP none   Past Medical History  Diagnosis Date  . Hypertension   . Prostate enlargement    History reviewed. No pertinent past surgical history. History reviewed. No pertinent family history. History  Substance Use Topics  . Smoking  status: Current Every Day Smoker -- 0.50 packs/day    Types: Cigarettes  . Smokeless tobacco: Not on file  . Alcohol Use: No    Review of Systems  Constitutional: Negative for fever and chills.  HENT: Negative for congestion and sore throat.   Eyes: Positive for photophobia, discharge, redness, itching and visual disturbance. Negative for pain.  Respiratory: Negative for cough and shortness of breath.   Cardiovascular: Negative for chest pain.  Musculoskeletal: Negative for neck pain and neck stiffness.  Neurological: Negative for dizziness and headaches.   Allergies  Review of patient's allergies indicates no known allergies.  Home Medications   Prior to Admission medications   Medication Sig Start Date End Date Taking? Authorizing Provider  aspirin EC 81 MG tablet Take 81 mg by mouth daily.    Historical Provider, MD  clindamycin (CLEOCIN) 150 MG capsule Take 2 capsules (300 mg total) by mouth 3 (three) times daily. 03/13/15   Jaidalyn Schillo, PA-C  cyclobenzaprine (FLEXERIL) 5 MG tablet Take 1 tablet (5 mg total) by mouth 3 (three) times daily as needed for muscle spasms. 12/05/14   Evalee Jefferson, PA-C  fluticasone (CUTIVATE) 0.05 % cream Apply 1 application topically daily as needed. 10/13/14   Historical Provider, MD  olmesartan-hydrochlorothiazide (BENICAR HCT) 20-12.5 MG per tablet Take 1 tablet by mouth daily.      Historical Provider, MD  predniSONE (DELTASONE) 10 MG tablet 6, 5, 4, 3, 2 then 1 tablet by mouth daily for 6 days total. 12/05/14   Evalee Jefferson, PA-C  tamsulosin [facility]) 0.4  MG CAPS capsule Take 1 capsule by mouth daily. 11/08/14   Historical Provider, MD   Triage Vitals: BP 138/68 mmHg  Pulse 64  Temp(Src) 97.7 F (36.5 C) (Oral)  Resp 20  Ht 6\' 1"  (1.854 m)  Wt 278 lb 12.8 oz (126.463 kg)  BMI 36.79 kg/m2  SpO2 95% Physical Exam  Constitutional: He is oriented to person, place, and time. He appears well-developed and well-nourished. No distress.  HENT:  Head:  Normocephalic and atraumatic.  Mouth/Throat: Oropharynx is clear and moist. No oropharyngeal exudate.  Eyes: EOM are normal. Pupils are equal, round, and reactive to light. Right eye exhibits discharge. Right eye exhibits no chemosis, no exudate and no hordeolum. No foreign body present in the right eye. Left eye exhibits discharge. Left eye exhibits no chemosis, no exudate and no hordeolum. No foreign body present in the left eye. Right conjunctiva is not injected. Right conjunctiva has no hemorrhage. Left conjunctiva is not injected. Left conjunctiva has no hemorrhage. Right eye exhibits normal extraocular motion and no nystagmus. Left eye exhibits normal extraocular motion and no nystagmus.  Fundoscopic exam:      The right eye shows no arteriolar narrowing, no AV nicking, no exudate, no hemorrhage and no papilledema.       The left eye shows no arteriolar narrowing, no AV nicking, no exudate, no hemorrhage and no papilledema.  Slit lamp exam:      The right eye shows no corneal abrasion, no corneal flare, no corneal ulcer, no foreign body, no hyphema and no hypopyon.       The left eye shows no corneal abrasion, no corneal flare, no corneal ulcer, no foreign body, no hyphema and no hypopyon.  Redness and flaking of the skin identified to the upper and lower eyelids. Peeling of the skin identified, appears irritated. Negative active drainage or bleeding noted.  Neck: Normal range of motion. Neck supple.  Cardiovascular: Normal rate, regular rhythm and normal heart sounds.  Exam reveals no friction rub.   No murmur heard. Pulmonary/Chest: Effort normal and breath sounds normal. No respiratory distress. He has no wheezes. He has no rales.  Musculoskeletal: Normal range of motion.  Neurological: He is alert and oriented to person, place, and time. No cranial nerve deficit. He exhibits normal muscle tone. Coordination normal.  Skin: Skin is warm. He is not diaphoretic.  Psychiatric: He has a normal  mood and affect. His behavior is normal. Thought content normal.  Nursing note and vitals reviewed.   ED Course  Procedures (including critical care time) DIAGNOSTIC STUDIES: Oxygen Saturation is 95% on RA, adequate by my interpretation.    COORDINATION OF CARE: 5:47 PM-Discussed treatment plan which includes fluorescent eye test with pt at bedside and pt agreed to plan.   Labs Review Labs Reviewed - No data to display  Imaging Review No results found.   EKG Interpretation None         Visual Acuity  Right Eye Distance: 50 Left Eye Distance: 50 Bilateral Distance: 40  Right Eye Near: R Near: 20 Left Eye Near:  L Near: 20 Bilateral Near:  20   6:09 PM Patient seen and assessed by attending physician, Dr. Pamella Pert. As per attending physician recommended antibiotics, clindamycin 300 mg 3 times a day. Recommended baby oil and soft soap to aid and soothing of the skin irritation.  MDM   Final diagnoses:  Blepharitis of both eyes  Eye irritation    Medications  fluorescein ophthalmic strip 1  strip (1 strip Both Eyes Given 03/13/15 1736)  tetracaine (PONTOCAINE) 0.5 % ophthalmic solution 2 drop (2 drops Right Eye Given 03/13/15 1736)  fluorescein ophthalmic strip 1 strip (1 strip Both Eyes Given 03/13/15 1750)    Filed Vitals:   03/13/15 1458  BP: 138/68  Pulse: 64  Temp: 97.7 F (36.5 C)  TempSrc: Oral  Resp: 20  Height: 6\' 1"  (1.854 m)  Weight: 278 lb 12.8 oz (126.463 kg)  SpO2: 95%   I personally performed the services described in this documentation, which was scribed in my presence. The recorded information has been reviewed and is accurate.   Patient presented to the ED with eye irritation that has been ongoing for approximately 3 weeks. Patient is been seen and assessed by his ophthalmologist and started on antibiotics ointments and drops have not been helping. Patient reports that he has been having irritation, dryness and flaking of the skin  around his eyes and a burning sensation. Patient reported that he has been having drainage coming from the inner aspect of his eyes mainly the thick mucus. Denied eye pain. Mild swelling erythema and flaking of the skin identified to the upper and lower eyelids and outer aspects of the eyelids. Tenderness upon palpation to this region secondary to irritation. EOMs intact. Thick mucus production identified to the medial canthus bilaterally. Negative findings of foreign body. Negative ecchymosis. Negative findings of anterior chamber obstruction or irritation. Doubt herpetic keratitis. Doubt shingles. Visual acuity unremarkable. Patient seen and assessed by attending physician. Recommended treatment for blepharitis. Patient stable, afebrile. Patient not septic appearing. Negative signs of respiratory distress. Discharged patient. Discussed with patient to rest and stay hydrated. Discussed with patient to use baby oil and cool compressions. Discussed with patient to take antibiotics as prescribed. Referred patient to health and [facility] and ophthalmologist. Discussed with patient to closely monitor symptoms and if symptoms are to worsen or change to report back to the ED - strict return instructions given.  Patient agreed to plan of care, understood, all questions answered.   Jamse Mead, PA-C 03/13/15 [place], PA-C 03/13/15 1851  Pamella Pert, MD 03/14/15 1224

## 2015-03-13 NOTE — Discharge Instructions (Signed)
Please call your doctor for a followup appointment within 24-48 hours. When you talk to your doctor please let them know that you were seen in the emergency department and have them acquire all of your records so that they can discuss the findings with you and formulate a treatment plan to fully care for your new and ongoing problems. Please follow-up with health and wellness Center Please follow-up with ophthalmologist Please rest and stay hydrated Please take antibiotics as prescribed Please apply soft soap or Dove soap to the face to aid in his soothing sensation Please apply cool compressions Please use baby oil for the eyes Please continue to monitor symptoms closely and if symptoms are to worsen or change (fever greater than 101, chills, sweating, nausea, vomiting, chest pain, shortness of breathe, difficulty breathing, weakness, numbness, tingling, worsening or changes to pain pattern, increased swelling, tearing, facial swelling, pus drainage, inability to see the eyes, floaters in your field of vision) please report back to the Emergency Department immediately.    Blepharitis Blepharitis is redness, soreness, and swelling (inflammation) of one or both eyelids. It may be caused by an allergic reaction or a bacterial infection. Blepharitis may also be associated with reddened, scaly skin (seborrhea) of the scalp and eyebrows. While you sleep, eye discharge may cause your eyelashes to stick together. Your eyelids may itch, burn, swell, and may lose their lashes. These will grow back. Your eyes may become sensitive. Blepharitis may recur and need repeated treatment. If this is the case, you may require further evaluation by an eye specialist (ophthalmologist). HOME CARE INSTRUCTIONS   Keep your hands clean.  Use a clean towel each time you dry your eyelids. Do not use this towel to clean other areas. Do not share a towel or makeup with anyone.  Wash your eyelids with warm water or warm water  mixed with a small amount of baby shampoo. Do this twice a day or as often as needed.  Wash your face and eyebrows at least once a day.  Use warm compresses 2 times a day for 10 minutes at a time, or as directed by your caregiver.  Apply antibiotic ointment as directed by your caregiver.  Avoid rubbing your eyes.  Avoid wearing makeup until you get better.  Follow up with your caregiver as directed. SEEK IMMEDIATE MEDICAL CARE IF:   You have pain, redness, or swelling that gets worse or spreads to other parts of your face.  Your vision changes, or you have pain when looking at lights or moving objects.  You have a fever.  Your symptoms continue for longer than 2 to 4 days or become worse. MAKE SURE YOU:   Understand these instructions.  Will watch your condition.  Will get help right away if you are not doing well or get worse. Document Released: 09/14/2000 Document Revised: 12/10/2011 Document Reviewed: 10/25/2010 [facility] Patient Information 2015 [place], [place]. This information is not intended to replace advice given to you by your health care provider. Make sure you discuss any questions you have with your health care provider.   Emergency Department Resource Guide 1) Find a Doctor and Pay Out of Pocket Although you won't have to find out who is covered by your insurance plan, it is a good idea to ask around and get recommendations. You will then need to call the office and see if the doctor you have chosen will accept you as a new patient and what types of options they offer for patients who  are self-pay. Some doctors offer discounts or will set up payment plans for their patients who do not have insurance, but you will need to ask so you aren't surprised when you get to your appointment.  2) Contact Your Local Health Department Not all health departments have doctors that can see patients for sick visits, but many do, so it is worth a call to see if yours does. If you  don't know where your local health department is, you can check in your phone book. The CDC also has a tool to help you locate your state's health department, and many state websites also have listings of all of their local health departments.  3) Find a [facility] If your illness is not likely to be very severe or complicated, you may want to try a walk in clinic. These are popping up all over the country in pharmacies, drugstores, and shopping centers. They're usually staffed by nurse practitioners or physician assistants that have been trained to treat common illnesses and complaints. They're usually fairly quick and inexpensive. However, if you have serious medical issues or chronic medical problems, these are probably not your best option.  No Primary Care Doctor: - Call Health Connect at  [phone] - they can help you locate a primary care doctor that  accepts your insurance, provides certain services, etc. - Physician Referral Service- [phone]  Chronic Pain Problems: Organization         Address  Phone   Notes  [facility]  [phone] Patients need to be referred by their primary care doctor.   Medication Assistance: Organization         Address  Phone   Notes  [facility] Medication [facility] [address] [place], [place] [address] [phone] --Must be a resident of [facility] -- Must have NO insurance coverage whatsoever (no Medicaid/ Medicare, etc.) -- The pt. MUST have a primary care doctor that directs their care regularly and follows them in the community   [facility]  [phone]   [employer]  [phone]    Agencies that provide inexpensive medical care: Organization         Address  Phone   Notes  [place]  [phone]   [facility]    [phone]   [facility] [place], [place] [address] [phone]   [place] [address], [place] [phone]   Planned Parenthood    [phone]   [facility]    [phone]   [place] and [place] [address], [place] Phone:  [phone], Fax:  [phone] Hours of Operation:  9 am - 6 pm, M-F.  Also accepts Medicaid/Medicare and self-pay.  [facility] for [place], [address], [place] Phone: [phone], Fax: [phone]. Hours of Operation:  8:30 am - 5:30 pm, M-F.  Also accepts Medicaid and self-pay.  [facility] [place] [address] [place] Phone: [phone]   [place], [place], [place] [phone], Ext. [phone] Mondays & Thursdays: 7-9 AM.  First 15 patients are seen on a first come, first serve basis.    [facility] Providers:  Organization         Address  Phone   Notes  [facility] [address], [place] [phone] Also accepts self-pay patients.  [facility] [address], [place]  [phone]   [place], [address], [place] [phone]   [facility] [address] [place] [phone]   Lucianne Lei [address], [place]   [phone] Only accepts [place] Access [place] patients after they have their name applied to their card.   Self-Pay (no insurance) in [facility]:  Organization         Address  Phone   Notes  Sickle Cell Patients, [facility] [place] [phone]   [facility] [place] [phone]   [facility] [place]  [place], [place], [place] [phone]   [facility]/Dr. Osei-Bonsu  [address], [place] or [address], [place] [phone] Phone  number for both [place] and [place] locations is the same.  [facility] and [facility] [address], [place] [phone]   [facility] [address] [place] or [address] [phone]   [facility] [address] [place] [phone], phone; [phone], fax Sees patients 1st and 3rd Saturday of every month.  Must not qualify for public or private insurance (i.e. Medicaid, Medicare, [place] Health Choice, Veterans' Benefits)  Household income should be no more than 200% of the poverty level The clinic cannot treat you if you are pregnant or think you are pregnant  Sexually transmitted diseases are not treated at the clinic.    Dental Care: Organization         Address  Phone  Notes  [facility] [place] [phone] Accepts children up to age 105 who are enrolled in [place] or [place]; pregnant women with a Medicaid card; and children who have applied for Medicaid or [place] Health Choice, but were declined, whose parents can pay a reduced fee at time of service.  [facility] of [facility]  [address], [place] [phone] Accepts children up to age 40 who are enrolled in [place] or [place]; pregnant women with a Medicaid card; and children who have applied for Medicaid or [place] Health Choice, but were declined, whose parents can pay a reduced fee at time of service.  [facility]  [place] [phone] Patients are seen by appointment only. Walk-ins are not accepted. [place] will see patients 33 years of age and older. Monday - Tuesday (8am-5pm) Most Wednesdays (8:30-5pm) $30 per visit, cash only  [facility]  [address], [facility] [phone] Patients are seen by appointment only.  Walk-ins are not accepted. [place] will see patients 29 years of age and older. One Wednesday Evening (Monthly: Volunteer Based).  $30 per visit, cash only  [place]  [phone] for adults; Children under age 53, call Graduate Pediatric Dentistry at [phone]. Children aged 46-14, please call [phone] to request a pediatric application.  Dental services are provided in all areas of dental care including fillings, crowns and bridges, complete and partial dentures, implants, gum treatment, root canals,  and extractions. Preventive care is also provided. Treatment is provided to both adults and children. °Patients are selected via a lottery and there is often a waiting list. °  °[facility] [address], °[place] ° ([phone] [email or web address] °  °[facility] [address], [place], [place] [phone], Ext. [phone] Second and Fourth Thursday of each month, opens at 6:30 AM; Clinic ends at 9 AM.  Patients are seen on a first-come first-served basis, and a limited number are seen during each clinic.  ° °[facility] ° [address], [place], [place] [phone]   Eligibility Requirements °You must have lived in [place], [place], or [place] counties for at least the last three months. °  You cannot be eligible for state or federal sponsored healthcare insurance, including Veterans Administration, Medicaid, or Medicare. °  You generally cannot be eligible for healthcare insurance through your employer.  °  How to apply: °Eligibility screenings are held every Tuesday and Wednesday afternoon from 1:00 pm until 4:00 pm. You do not need an appointment for the interview!  °[facility] [address], [place], [place] [phone]   °[facility]  [phone]   °[facility]  [phone]   °[facility]  [phone]   ° °Behavioral Health  Resources in the Community: °Intensive Outpatient Programs °Organization         Address  Phone  Notes  °[facility] [address], [place], [place] [phone]   °[facility] [address], [place], [place] [phone]   °[facility] [address], [place], [place] ° [phone]   °[facility] [address],  °[place], [place] 1-800-853-5163 or [phone]   °Substance Abuse Resources °Organization         Address  Phone  Notes  °[facility]  [phone]   °[facility]  [phone]   °[facility]  [phone]   °[facility]  [phone]   °Residential & Outpatient Substance Abuse Program  1-800-659-3381   °Psychological Services °Organization         Address  Phone  Notes  °[facility]  336- [phone]   °[facility]  336- [phone]   °[facility] [address], [place] 1-800-853-5163 or 336-641-4981   ° °Mobile Crisis Teams °Organization         Address  Phone  Notes  °[facility], Mobile Crisis Care Unit  1-877-626-1772   °Assertive °[facility] Services ° [address] [place], [place] [phone]   °Sharon DeEsch [address] °[place] [phone]   ° °Self-Help/Support Groups °Organization         Address  Phone             Notes  °[facility] [place] - variety of support groups  336- [phone] Call for more information  °Narcotics Anonymous (NA), [facility] [address], °[place]  2 meetings at this location  ° °Residential Treatment Programs °Organization         Address  Phone  Notes  °[facility] [address],    °[place]  1-866-801-8205   °[facility] ° [address], [place], [place] [phone]   °[facility] [address], [place] [phone] Admissions: 8am-3pm M-F  °[facility] [address]    °[place], [place] 336-841-1104   °[facility] [address], [place], [place] [phone]   °[facility] [address]  °[place], [place] 336-285-9073   °[facility] - Intensive Outpatient [address], [place], [place] 336-852-3033   °[facility] [address]  °[place], [place] 1-877-615-2722 or 336-784-9470   °[facility] ([facility]) [address] [place], [place] 336-227-7417 Accepts Medicaid  °[facility] [address]  °[place] 1-800-659-3381 Substance Abuse/Addiction Treatment  ° °[place] [facility] Resources °Organization         Address  Phone  Notes  °[facility]  (888) 581-9988   °Julie Brannon, PhD [address] [place], [place]   (336) 349-5553 or (336) 951-0000   °[facility]   [address] °[place], [place] (336) 349-4454   °[facility] [address], [place], [place] (336) 342-8316 Insurance/Medicaid/sponsorship through [facility]  °[facility] [address]                                    [place], [place] (336) 342-8316 Therapy/tele-psych/case  °[facility] [address]  ° [place], [place] (336) 349-2233    °Dr. Arfeen  (336) 349-4544   °[facility] [place]  [employer] [place] [facility] 1) [address], [place] °2) [address], [place] °3)  [address], [place] (336) 349-3220 °(336) 342-7768 ° °(336) 342-8140   °[place] Child Abuse Hotline (336) 342-1394 or (336) 342-3537 (After Hours)    ° ° ° °

## 2015-03-13 NOTE — ED Notes (Signed)
Pt reports 3 weeks left eye redness, itching, burning.  Went to eye doctor who prescribed Neomycin and polymyxin B sulfate and desxamethasone ointment.  Pt called back to eye doctor to report no relief, then was called in the same med but in a suspension.  Pt has been using ointment and suspension.  Pt also bought OTC pink eye relief and has been using all three meds.  Pt has been taking Benadryl for itching.  Symptoms has not improved.  Past several mornings eyes matted shut, left worse than right.

## 2015-03-24 ENCOUNTER — Encounter: Payer: Self-pay | Admitting: Gastroenterology

## 2015-07-18 ENCOUNTER — Encounter: Payer: Self-pay | Admitting: Gastroenterology

## 2015-09-05 ENCOUNTER — Encounter: Payer: Self-pay | Admitting: Gastroenterology

## 2015-09-21 ENCOUNTER — Ambulatory Visit: Payer: 59 | Admitting: Urology

## 2015-10-03 ENCOUNTER — Ambulatory Visit (HOSPITAL_BASED_OUTPATIENT_CLINIC_OR_DEPARTMENT_OTHER): Payer: 59 | Attending: Nurse Practitioner | Admitting: *Deleted

## 2015-10-03 VITALS — Ht 72.0 in | Wt 278.0 lb

## 2015-10-03 DIAGNOSIS — R0683 Snoring: Secondary | ICD-10-CM | POA: Diagnosis not present

## 2015-10-03 DIAGNOSIS — I493 Ventricular premature depolarization: Secondary | ICD-10-CM | POA: Insufficient documentation

## 2015-10-03 DIAGNOSIS — G4733 Obstructive sleep apnea (adult) (pediatric): Secondary | ICD-10-CM | POA: Insufficient documentation

## 2015-10-03 DIAGNOSIS — R5383 Other fatigue: Secondary | ICD-10-CM | POA: Diagnosis not present

## 2015-10-03 DIAGNOSIS — G4719 Other hypersomnia: Secondary | ICD-10-CM | POA: Diagnosis not present

## 2015-10-08 DIAGNOSIS — G4733 Obstructive sleep apnea (adult) (pediatric): Secondary | ICD-10-CM | POA: Diagnosis not present

## 2015-10-08 NOTE — Progress Notes (Signed)
Patient Name: Manuel Williams, Manuel Williams Date: 10/03/2015 Gender: Male D.O.B: 09-Jan-1956 Age (years): 59 Referring Provider: Delia Chimes Height (inches): 72 Interpreting Physician: Baird Lyons MD, ABSM Weight (lbs): 278 RPSGT: Gerhard Perches BMI: 38 MRN: [id number] Neck Size: 19.10 CLINICAL INFORMATION Sleep Study Type: Split Night CPAP   Indication for sleep study: Excessive Daytime Sleepiness, Fatigue   Epworth Sleepiness Score:  16/24 SLEEP STUDY TECHNIQUE As per the AASM Manual for the Scoring of Sleep and Associated Events v2.3 (April 2016) with a hypopnea requiring 4% desaturations. The channels recorded and monitored were frontal, central and occipital EEG, electrooculogram (EOG), submentalis EMG (chin), nasal and oral airflow, thoracic and abdominal wall motion, anterior tibialis EMG, snore microphone, electrocardiogram, and pulse oximetry. Continuous positive airway pressure (CPAP) was initiated when the patient met split night criteria and was titrated according to treat sleep-disordered breathing.  MEDICATIONS Medications taken by the patient : charted for review Medications administered by patient during sleep study : No sleep medicine administered.  RESPIRATORY PARAMETERS Diagnostic Total AHI (/hr): 67.2 RDI (/hr): 68.3 OA Index (/hr): 47.6 CA Index (/hr): 0.0 REM AHI (/hr): N/A NREM AHI (/hr): 67.2 Supine AHI (/hr): 100.9 Non-supine AHI (/hr): 62.73 Min O2 Sat (%): 83.00 Mean O2 (%): 92.56 Time below 88% (min): 11.9   Titration Optimal Pressure (cm): 16 AHI at Optimal Pressure (/hr): 0.0 Min O2 at Optimal Pressure (%): 87.0 Supine % at Optimal (%): 40 Sleep % at Optimal (%): 95    SLEEP ARCHITECTURE The recording time for the entire night was 401.4 minutes. During a baseline period of 182.1 minutes, the patient slept for 161.5 minutes in REM and nonREM, yielding a sleep efficiency of 88.7%. Sleep onset after lights out was 4.1 minutes with a REM latency of N/A  minutes. The patient spent 5.26% of the night in stage N1 sleep, 94.74% in stage N2 sleep, 0.00% in stage N3 and 0.00% in REM. During the titration period of 216.6 minutes, the patient slept for 211.2 minutes in REM and nonREM, yielding a sleep efficiency of 97.5%. Sleep onset after CPAP initiation was 0.0 minutes with a REM latency of 38.2 minutes. The patient spent 1.89% of the night in stage N1 sleep, 61.18% in stage N2 sleep, 0.71% in stage N3 and 36.22% in REM.  CARDIAC DATA The 2 lead EKG demonstrated sinus rhythm. The mean heart rate was 49.89 beats per minute. Other EKG findings include: PVCs.  LEG MOVEMENT DATA The total Periodic Limb Movements of Sleep (PLMS) were 224. The PLMS index was 35.79 . Limb movements with arousal/ hr  1.7/ hr  IMPRESSIONS - Severe obstructive sleep apnea occurred during the diagnostic portion of the study (AHI = 67.2/hour). An optimal PAP pressure was selected for this patient ( 16 cm of water) - No significant central sleep apnea occurred during the diagnostic portion of the study (CAI = 0.0/hour). - Mild oxygen desaturation was noted during the diagnostic portion of the study (Min O2 = 83.00%). - The patient snored with Moderate snoring volume during the diagnostic portion of the study. - EKG findings include PVCs. - Mild periodic limb movements of sleep occurred during the study.  DIAGNOSIS - Obstructive Sleep Apnea (327.23 [G47.33 ICD-10])  RECOMMENDATIONS - Trial of CPAP therapy on 16 cm H2O with a Large size Fisher&Paykel Full Face Mask Simplus mask and heated humidification. - Avoid alcohol, sedatives and other CNS depressants that may worsen sleep apnea and disrupt normal sleep architecture. - Sleep hygiene should be reviewed to assess factors that  may improve sleep quality. - Weight management and regular exercise should be initiated or continued.  Deneise Lever Diplomate, American Board of Sleep Medicine  ELECTRONICALLY SIGNED ON:   10/08/2015, 4:23 PM [place] PH: [phone]   FX: [phone] [place]

## 2015-10-21 ENCOUNTER — Ambulatory Visit (INDEPENDENT_AMBULATORY_CARE_PROVIDER_SITE_OTHER): Payer: Commercial Managed Care - HMO | Admitting: Urology

## 2015-10-21 DIAGNOSIS — R3915 Urgency of urination: Secondary | ICD-10-CM

## 2015-10-21 DIAGNOSIS — N5201 Erectile dysfunction due to arterial insufficiency: Secondary | ICD-10-CM

## 2015-10-21 DIAGNOSIS — N401 Enlarged prostate with lower urinary tract symptoms: Secondary | ICD-10-CM | POA: Diagnosis not present

## 2015-10-21 DIAGNOSIS — R350 Frequency of micturition: Secondary | ICD-10-CM

## 2015-10-25 ENCOUNTER — Ambulatory Visit (AMBULATORY_SURGERY_CENTER): Payer: Self-pay | Admitting: *Deleted

## 2015-10-25 VITALS — Ht 72.0 in | Wt 290.2 lb

## 2015-10-25 DIAGNOSIS — Z86010 Personal history of colonic polyps: Secondary | ICD-10-CM

## 2015-10-25 MED ORDER — NA SULFATE-K SULFATE-MG SULF 17.5-3.13-1.6 GM/177ML PO SOLN: ORAL | Status: DC

## 2015-10-25 NOTE — Progress Notes (Signed)
No allergies to eggs or soy. No problems with anesthesia.  Pt given Emmi instructions for colonoscopy  No oxygen use  No diet drug use  

## 2015-11-08 ENCOUNTER — Encounter: Payer: Self-pay | Admitting: Gastroenterology

## 2015-11-08 ENCOUNTER — Ambulatory Visit (AMBULATORY_SURGERY_CENTER): Payer: Commercial Managed Care - HMO | Admitting: Gastroenterology

## 2015-11-08 VITALS — BP 144/54 | HR 64 | Temp 97.9°F | Resp 20 | Ht 72.0 in | Wt 290.0 lb

## 2015-11-08 DIAGNOSIS — Z86010 Personal history of colonic polyps: Secondary | ICD-10-CM | POA: Diagnosis present

## 2015-11-08 DIAGNOSIS — D123 Benign neoplasm of transverse colon: Secondary | ICD-10-CM

## 2015-11-08 DIAGNOSIS — D122 Benign neoplasm of ascending colon: Secondary | ICD-10-CM | POA: Diagnosis not present

## 2015-11-08 DIAGNOSIS — D120 Benign neoplasm of cecum: Secondary | ICD-10-CM

## 2015-11-08 MED ORDER — SODIUM CHLORIDE 0.9 % IV SOLN: 500.0000 mL | INTRAVENOUS | Status: DC

## 2015-11-08 NOTE — Op Note (Signed)
[place]  [employer] [place], [id number]   COLONOSCOPY PROCEDURE REPORT  PATIENT: Notnamed, [place]  MR#: [id number] BIRTHDATE: Oct 25, 1955 , 59  yrs. old GENDER: male ENDOSCOPIST: Milus Banister, MD PROCEDURE DATE:  11/08/2015 PROCEDURE:   Colonoscopy, surveillance and Colonoscopy with snare polypectomy First Screening Colonoscopy - Avg.  risk and is 50 yrs.  old or older - No.  Prior Negative Screening - Now for repeat screening. N/A  History of Adenoma - Now for follow-up colonoscopy & has been > or = to 3 yrs.  Yes hx of adenoma.  Has been 3 or more years since last colonoscopy.  Polyps removed today? Yes ASA CLASS:   Class II INDICATIONS:Surveillance due to prior colonic neoplasia and 2011 Colonoscopy Dr.  Ardis Hughs with 3 subCM polyps removed, 2 were adenomas. MEDICATIONS: Monitored anesthesia care and Propofol 250 mg IV  DESCRIPTION OF PROCEDURE:   After the risks benefits and alternatives of the procedure were thoroughly explained, informed consent was obtained.  The digital rectal exam revealed no abnormalities of the rectum.   The LB CF-[id number] Loaner [id number] endoscope was introduced through the anus and advanced to the cecum, which was identified by both the appendix and ileocecal valve. No adverse events experienced.   The quality of the prep was excellent.  The instrument was then slowly withdrawn as the colon was fully examined. Estimated blood loss is zero unless otherwise noted in this procedure report.   COLON FINDINGS: Four sessile polyps were found, removed and sent to pathology.  These were 3-70mm across, located in cecum, ascending and transverse segments and were all removed with cold snare.  The examination was otherwise normal.  Retroflexed views revealed no abnormalities. The time to cecum = 2.6 Withdrawal time = 9.3   The scope was withdrawn and the procedure completed. COMPLICATIONS: There were no immediate  complications.  ENDOSCOPIC IMPRESSION: Four sessile polyps were found, removed and sent to pathology. These were 3-41mm across, located in cecum, ascending and transverse segments and were all removed with cold snare.  The examination was otherwise normal  RECOMMENDATIONS: If the polyp(s) removed today are proven to be adenomatous (pre-cancerous) polyps, you will need a colonoscopy in 3-5 years. Otherwise you should continue to follow colorectal cancer screening guidelines for "routine risk" patients with a colonoscopy in 10 years.  You will receive a letter within 1-2 weeks with the results of your biopsy as well as final recommendations.  Please call my office if you have not received a letter after 3 weeks.  eSigned:  Milus Banister, MD 11/08/2015 8:19 AM   cc: Delia Chimes, MD

## 2015-11-08 NOTE — Patient Instructions (Signed)
YOU HAD AN ENDOSCOPIC PROCEDURE TODAY AT THE [facility]:   Refer to the procedure report that was given to you for any specific questions about what was found during the examination.  If the procedure report does not answer your questions, please call your gastroenterologist to clarify.  If you requested that your care partner not be given the details of your procedure findings, then the procedure report has been included in a sealed envelope for you to review at your convenience later.  YOU SHOULD EXPECT: Some feelings of bloating in the abdomen. Passage of more gas than usual.  Walking can help get rid of the air that was put into your GI tract during the procedure and reduce the bloating. If you had a lower endoscopy (such as a colonoscopy or flexible sigmoidoscopy) you may notice spotting of blood in your stool or on the toilet paper. If you underwent a bowel prep for your procedure, you may not have a normal bowel movement for a few days.  Please Note:  You might notice some irritation and congestion in your nose or some drainage.  This is from the oxygen used during your procedure.  There is no need for concern and it should clear up in a day or so.  SYMPTOMS TO REPORT IMMEDIATELY:   Following lower endoscopy (colonoscopy or flexible sigmoidoscopy):  Excessive amounts of blood in the stool  Significant tenderness or worsening of abdominal pains  Swelling of the abdomen that is new, acute  Fever of 100F or higher  For urgent or emergent issues, a gastroenterologist can be reached at any hour by calling [phone].  DIET: Your first meal following the procedure should be a small meal and then it is ok to progress to your normal diet. Heavy or fried foods are harder to digest and may make you feel nauseous or bloated.  Likewise, meals heavy in dairy and vegetables can increase bloating.  Drink plenty of fluids but you should avoid alcoholic beverages for 24 hours.  ACTIVITY:   You should plan to take it easy for the rest of today and you should NOT DRIVE or use heavy machinery until tomorrow (because of the sedation medicines used during the test).    FOLLOW UP: Our staff will call the number listed on your records the next business day following your procedure to check on you and address any questions or concerns that you may have regarding the information given to you following your procedure. If we do not reach you, we will leave a message.  However, if you are feeling well and you are not experiencing any problems, there is no need to return our call.  We will assume that you have returned to your regular daily activities without incident.  If any biopsies were taken you will be contacted by phone or by letter within the next 1-3 weeks.  Please call us at [phone] if you have not heard about the biopsies in 3 weeks.    SIGNATURES/CONFIDENTIALITY: You and/or your care partner have signed paperwork which will be entered into your electronic medical record.  These signatures attest to the fact that that the information above on your After Visit Summary has been reviewed and is understood.  Full responsibility of the confidentiality of this discharge information lies with you and/or your care-partner.  Please read over handout about polyps  Please continue your normal medications   

## 2015-11-08 NOTE — Progress Notes (Signed)
Report to PACU, RN, vss, BBS= Clear.  

## 2015-11-08 NOTE — Progress Notes (Signed)
Discharge instructions given per Loel Ro LPN

## 2015-11-08 NOTE — Progress Notes (Signed)
Called to room to assist during endoscopic procedure.  Patient ID and intended procedure confirmed with present staff. Received instructions for my participation in the procedure from the performing physician.  

## 2015-11-09 ENCOUNTER — Telehealth: Payer: Self-pay

## 2015-11-09 NOTE — Telephone Encounter (Signed)
  Follow up Call-  Call back number 11/08/2015  Post procedure Call Back phone  # [phone]  Permission to leave phone message Yes     Patient was called for follow up after procedure on 11/08/2015. No answer at the number given for follow up phone call. A message was left on the answering machine.

## 2015-11-15 ENCOUNTER — Encounter: Payer: Self-pay | Admitting: Gastroenterology

## 2015-12-09 ENCOUNTER — Ambulatory Visit: Payer: Commercial Managed Care - HMO | Admitting: Urology

## 2015-12-21 ENCOUNTER — Ambulatory Visit: Payer: Commercial Managed Care - HMO | Admitting: Urology

## 2015-12-23 ENCOUNTER — Ambulatory Visit: Payer: Commercial Managed Care - HMO | Admitting: Urology

## 2016-04-15 ENCOUNTER — Emergency Department (HOSPITAL_COMMUNITY): Payer: Commercial Managed Care - HMO

## 2016-04-15 ENCOUNTER — Encounter (HOSPITAL_COMMUNITY): Payer: Self-pay | Admitting: Emergency Medicine

## 2016-04-15 ENCOUNTER — Emergency Department (HOSPITAL_COMMUNITY)
Admission: EM | Admit: 2016-04-15 | Discharge: 2016-04-15 | Disposition: A | Discharge status: Home / Self Care | Payer: Commercial Managed Care - HMO | Attending: Emergency Medicine | Admitting: Emergency Medicine

## 2016-04-15 DIAGNOSIS — Z79899 Other long term (current) drug therapy: Secondary | ICD-10-CM | POA: Insufficient documentation

## 2016-04-15 DIAGNOSIS — J40 Bronchitis, not specified as acute or chronic: Secondary | ICD-10-CM | POA: Diagnosis not present

## 2016-04-15 DIAGNOSIS — I10 Essential (primary) hypertension: Secondary | ICD-10-CM | POA: Diagnosis not present

## 2016-04-15 DIAGNOSIS — F17210 Nicotine dependence, cigarettes, uncomplicated: Secondary | ICD-10-CM | POA: Insufficient documentation

## 2016-04-15 DIAGNOSIS — Z7982 Long term (current) use of aspirin: Secondary | ICD-10-CM | POA: Diagnosis not present

## 2016-04-15 DIAGNOSIS — R0789 Other chest pain: Secondary | ICD-10-CM | POA: Diagnosis present

## 2016-04-15 HISTORY — DX: Pure hypercholesterolemia, unspecified: E78.00

## 2016-04-15 LAB — BASIC METABOLIC PANEL
Anion gap: 3 — ABNORMAL LOW (ref 5–15)
BUN: 30 mg/dL — AB (ref 6–20)
CHLORIDE: 105 mmol/L (ref 101–111)
CO2: 28 mmol/L (ref 22–32)
Calcium: 9.2 mg/dL (ref 8.9–10.3)
Creatinine, Ser: 1.11 mg/dL (ref 0.61–1.24)
GFR calc Af Amer: >60 mL/min (ref >60)
GFR calc non Af Amer: >60 mL/min (ref >60)
Glucose, Bld: 107 mg/dL — ABNORMAL HIGH (ref 65–99)
POTASSIUM: 4 mmol/L (ref 3.5–5.1)
SODIUM: 136 mmol/L (ref 135–145)

## 2016-04-15 LAB — CBC
HEMATOCRIT: 48.7 % (ref 39.0–52.0)
Hemoglobin: 16.5 g/dL (ref 13.0–17.0)
MCH: 32.2 pg (ref 26.0–34.0)
MCHC: 33.9 g/dL (ref 30.0–36.0)
MCV: 94.9 fL (ref 78.0–100.0)
Platelets: 210 10*3/uL (ref 150–400)
RBC: 5.13 MIL/uL (ref 4.22–5.81)
RDW: 13 % (ref 11.5–15.5)
WBC: 9.9 10*3/uL (ref 4.0–10.5)

## 2016-04-15 LAB — I-STAT TROPONIN, ED: Troponin i, poc: 0 ng/mL (ref 0.00–0.08)

## 2016-04-15 MED ORDER — AZITHROMYCIN 250 MG PO TABS: ORAL_TABLET | ORAL | Status: DC

## 2016-04-15 MED ORDER — IPRATROPIUM-ALBUTEROL 0.5-2.5 (3) MG/3ML IN SOLN
3.0000 mL | Freq: Once | RESPIRATORY_TRACT | Status: AC
  Administered 2016-04-15: 3 mL via RESPIRATORY_TRACT
  Filled 2016-04-15: qty 3

## 2016-04-15 MED ORDER — ALBUTEROL SULFATE HFA 108 (90 BASE) MCG/ACT IN AERS: 1.0000 | INHALATION_SPRAY | Freq: Four times a day (QID) | RESPIRATORY_TRACT | Status: DC | PRN

## 2016-04-15 MED ORDER — PREDNISONE 10 MG PO TABS: 20.0000 mg | ORAL_TABLET | Freq: Every day | ORAL | Status: DC

## 2016-04-15 NOTE — ED Provider Notes (Signed)
CSN: [id number]     Arrival date & time 04/15/16  1503 History   First MD Initiated Contact with Patient 04/15/16 [phone]     Chief Complaint  Patient presents with  . Chest Pain     (Consider location/radiation/quality/duration/timing/severity/associated sxs/prior Treatment) HPI ....Marland KitchenMarland KitchenTightness in chest for 4 days with associated cough, wheezing, productive cough. No previous history of cardiac disease. Past medical history includes hypertension, cholesterol, smoking. No dyspnea, diaphoresis, nausea. He feels he has a upper respiratory infection. No fever, sweats, chills. Severity is mild to moderate.  Past Medical History  Diagnosis Date  . Hypertension   . Prostate enlargement   . Sleep apnea     just diagnosed; will be getting cpap  . High cholesterol    Past Surgical History  Procedure Laterality Date  . Cyst removal neck  1970   Family History  Problem Relation Age of Onset  . Colon cancer Neg Hx    Social History  Substance Use Topics  . Smoking status: Current Every Day Smoker -- 1.00 packs/day    Types: Cigarettes  . Smokeless tobacco: Former Systems developer    Quit date: 10/01/1968  . Alcohol Use: 0.6 oz/week    1 Cans of beer per week    Review of Systems  All other systems reviewed and are negative.     Allergies  Review of patient's allergies indicates no known allergies.  Home Medications   Prior to Admission medications   Medication Sig Start Date End Date Taking? Authorizing Provider  alfuzosin (UROXATRAL) 10 MG 24 hr tablet Take 10 mg by mouth daily with breakfast.   Yes Historical Provider, MD  Ascorbic Acid (VITAMIN C PO) Take 1 tablet by mouth daily.   Yes Historical Provider, MD  aspirin EC 81 MG tablet Take 81 mg by mouth daily.   Yes Historical Provider, MD  Cholecalciferol (VITAMIN D PO) Take 1 capsule by mouth at bedtime.   Yes Historical Provider, MD  fluticasone (CUTIVATE) 0.05 % cream APPLY A SMALL AMOUNT TO AFFECTED ARES TWICE A DAY. 03/03/16  Yes  Historical Provider, MD  olmesartan-hydrochlorothiazide (BENICAR HCT) 40-12.5 MG tablet Take 1 tablet daily. 04/04/16  Yes Historical Provider, MD  tamsulosin (FLOMAX) 0.4 MG CAPS capsule Take 1 capsule daily. 04/04/16  Yes Historical Provider, MD  Vitamin D, Ergocalciferol, (DRISDOL) 50000 units CAPS capsule Take 50,000 Units by mouth once a week. 10/09/15  Yes Historical Provider, MD  albuterol (PROVENTIL HFA;VENTOLIN HFA) 108 (90 Base) MCG/ACT inhaler Inhale 1-2 puffs into the lungs every 6 (six) hours as needed for wheezing or shortness of breath. 04/15/16   Nat Christen, MD  azithromycin (ZITHROMAX Z-PAK) 250 MG tablet As directed 04/15/16   Nat Christen, MD  predniSONE (DELTASONE) 10 MG tablet Take 2 tablets (20 mg total) by mouth daily. 04/15/16   Nat Christen, MD   BP 92/57 mmHg  Pulse 60  Temp(Src) 97.7 F (36.5 C) (Oral)  Resp 11  Ht 6\' 1"  (1.854 m)  Wt 240 lb (108.863 kg)  BMI 31.67 kg/m2  SpO2 99% Physical Exam  Constitutional: He is oriented to person, place, and time. He appears well-developed and well-nourished.  HENT:  Head: Normocephalic and atraumatic.  Eyes: Conjunctivae are normal.  Neck: Neck supple.  Cardiovascular: Normal rate and regular rhythm.   Pulmonary/Chest: Effort normal and breath sounds normal.  Abdominal: Soft. Bowel sounds are normal.  Musculoskeletal: Normal range of motion.  Neurological: He is alert and oriented to person, place, and time.  Skin: Skin  is warm and dry.  Psychiatric: He has a normal mood and affect. His behavior is normal.  Nursing note and vitals reviewed.   ED Course  Procedures (including critical care time) Labs Review Labs Reviewed  BASIC METABOLIC PANEL - Abnormal; Notable for the following:    Glucose, Bld 107 (*)    BUN 30 (*)    Anion gap 3 (*)    All other components within normal limits  CBC  I-STAT TROPOININ, ED    Imaging Review Dg Chest 2 View  04/15/2016  CLINICAL DATA:  Chest pain and tightness. Productive cough for  cervical days. EXAM: CHEST  2 VIEW COMPARISON:  Two-view chest x-ray 12/05/2014 FINDINGS: The heart size is normal. Chronic interstitial coarsening is present. No focal airspace consolidation is present. There is no edema or effusion. Exaggerated thoracic kyphosis is similar to the prior exam. IMPRESSION: 1. No acute cardiopulmonary disease or significant interval change. 2. Exaggerated thoracic kyphosis is stable. 3. Emphysema. Electronically Signed   By: San Morelle M.D.   On: 04/15/2016 15:50   I have personally reviewed and evaluated these images and lab results as part of my medical decision-making.   EKG Interpretation None      MDM   Final diagnoses:  Bronchitis    Both patient and wife state that he has an upper respiratory infection. Although his cardiac workup was normal, I recommended further cardiology evaluation. Discharge medications include Zithromax, prednisone, albuterol inhaler.    Nat Christen, MD 04/15/16 [phone]

## 2016-04-15 NOTE — Discharge Instructions (Signed)
Tests were good. I will treat you for bronchitis with antibiotic, inhaler, prednisone. However, I would like you to follow-up with cardiology also. Phone number given.

## 2016-04-15 NOTE — ED Notes (Signed)
Patient c/o chest tightness x4 days. Per patient shortness of breath and dizziness. Denies any cardiac hx. Patient does report occasional productive cough with thick green sputum. Denies any fevers.

## 2016-05-04 ENCOUNTER — Ambulatory Visit: Payer: Commercial Managed Care - HMO | Admitting: Urology

## 2016-06-08 ENCOUNTER — Ambulatory Visit: Payer: Commercial Managed Care - HMO | Admitting: Urology

## 2016-11-14 ENCOUNTER — Emergency Department (HOSPITAL_COMMUNITY): Payer: Commercial Managed Care - HMO

## 2016-11-14 ENCOUNTER — Emergency Department (HOSPITAL_COMMUNITY)
Admission: EM | Admit: 2016-11-14 | Discharge: 2016-11-14 | Disposition: A | Discharge status: Home / Self Care | Payer: Commercial Managed Care - HMO | Attending: Emergency Medicine | Admitting: Emergency Medicine

## 2016-11-14 ENCOUNTER — Encounter (HOSPITAL_COMMUNITY): Payer: Self-pay | Admitting: Emergency Medicine

## 2016-11-14 DIAGNOSIS — I10 Essential (primary) hypertension: Secondary | ICD-10-CM | POA: Diagnosis not present

## 2016-11-14 DIAGNOSIS — Z79899 Other long term (current) drug therapy: Secondary | ICD-10-CM | POA: Diagnosis not present

## 2016-11-14 DIAGNOSIS — R05 Cough: Secondary | ICD-10-CM | POA: Diagnosis not present

## 2016-11-14 DIAGNOSIS — J029 Acute pharyngitis, unspecified: Secondary | ICD-10-CM | POA: Insufficient documentation

## 2016-11-14 DIAGNOSIS — R509 Fever, unspecified: Secondary | ICD-10-CM | POA: Insufficient documentation

## 2016-11-14 DIAGNOSIS — R0981 Nasal congestion: Secondary | ICD-10-CM | POA: Insufficient documentation

## 2016-11-14 DIAGNOSIS — R69 Illness, unspecified: Secondary | ICD-10-CM

## 2016-11-14 DIAGNOSIS — R059 Cough, unspecified: Secondary | ICD-10-CM

## 2016-11-14 DIAGNOSIS — R062 Wheezing: Secondary | ICD-10-CM | POA: Insufficient documentation

## 2016-11-14 DIAGNOSIS — F17210 Nicotine dependence, cigarettes, uncomplicated: Secondary | ICD-10-CM | POA: Insufficient documentation

## 2016-11-14 DIAGNOSIS — J111 Influenza due to unidentified influenza virus with other respiratory manifestations: Secondary | ICD-10-CM

## 2016-11-14 MED ORDER — IPRATROPIUM-ALBUTEROL 0.5-2.5 (3) MG/3ML IN SOLN
3.0000 mL | Freq: Once | RESPIRATORY_TRACT | Status: AC
  Administered 2016-11-14: 3 mL via RESPIRATORY_TRACT
  Filled 2016-11-14: qty 3

## 2016-11-14 MED ORDER — GUAIFENESIN-CODEINE 100-10 MG/5ML PO SYRP: 10.0000 mL | ORAL_SOLUTION | Freq: Three times a day (TID) | ORAL | 0 refills | Status: DC | PRN

## 2016-11-14 MED ORDER — ALBUTEROL SULFATE (2.5 MG/3ML) 0.083% IN NEBU
2.5000 mg | INHALATION_SOLUTION | Freq: Once | RESPIRATORY_TRACT | Status: AC
  Administered 2016-11-14: 2.5 mg via RESPIRATORY_TRACT
  Filled 2016-11-14: qty 3

## 2016-11-14 MED ORDER — ALBUTEROL SULFATE HFA 108 (90 BASE) MCG/ACT IN AERS
2.0000 | INHALATION_SPRAY | Freq: Once | RESPIRATORY_TRACT | Status: AC
  Administered 2016-11-14: 2 via RESPIRATORY_TRACT
  Filled 2016-11-14: qty 6.7

## 2016-11-14 MED ORDER — PREDNISONE 20 MG PO TABS: 40.0000 mg | ORAL_TABLET | Freq: Every day | ORAL | 0 refills | Status: DC

## 2016-11-14 NOTE — ED Triage Notes (Signed)
Pt wife reports fever/chills/cough since yesterday. denies v/d.

## 2016-11-14 NOTE — ED Notes (Signed)
Respiratory paged

## 2016-11-14 NOTE — ED Notes (Signed)
Respiratory paged for nebulizer tx at this time.

## 2016-11-14 NOTE — Discharge Instructions (Signed)
Drink plenty of fluids.  Alternate tylenol and ibuprofen every 4-6 hrs for fever.  Follow-up with your doctor for recheck.  Return here for any worsening symptoms

## 2016-11-18 NOTE — ED Provider Notes (Signed)
[facility] DEPT Provider Note   CSN: [id number] Arrival date & time: 11/14/16  1213     History   Chief Complaint Chief Complaint  Patient presents with  . Fever    HPI Manuel Williams is a 61 y.o. male.  HPI   Manuel Williams is a 61 y.o. male who presents to the Emergency Department complaining of fever, chills, cough and generalized body aches.  Symptoms have been present for one day.  He reports cough is associated with wheezing.  He reports cough has been persistent and non-productive.  He denies shortness of breath, chest pain, vomiting and sore throat.    Past Medical History:  Diagnosis Date  . High cholesterol   . Hypertension   . Prostate enlargement   . Sleep apnea    just diagnosed; will be getting cpap    There are no active problems to display for this patient.   Past Surgical History:  Procedure Laterality Date  . CYST REMOVAL NECK  1970       Home Medications    Prior to Admission medications   Medication Sig Start Date End Date Taking? Authorizing Provider  albuterol (PROVENTIL HFA;VENTOLIN HFA) 108 (90 Base) MCG/ACT inhaler Inhale 1-2 puffs into the lungs every 6 (six) hours as needed for wheezing or shortness of breath. 04/15/16  Yes Nat Christen, MD  alfuzosin (UROXATRAL) 10 MG 24 hr tablet Take 10 mg by mouth daily with breakfast.   Yes Historical Provider, MD  olmesartan-hydrochlorothiazide (BENICAR HCT) 40-12.5 MG tablet Take 1 tablet daily. 04/04/16  Yes Historical Provider, MD  tamsulosin (FLOMAX) 0.4 MG CAPS capsule Take 1 capsule daily. 04/04/16  Yes Historical Provider, MD  Vitamin D, Ergocalciferol, (DRISDOL) 50000 units CAPS capsule Take 50,000 Units by mouth once a week. 10/09/15  Yes Historical Provider, MD  guaiFENesin-codeine (ROBITUSSIN AC) 100-10 MG/5ML syrup Take 10 mLs by mouth 3 (three) times daily as needed. 11/14/16   Layth Cerezo, PA-C  predniSONE (DELTASONE) 20 MG tablet Take 2 tablets (40 mg total) by mouth daily. For 5 days  11/14/16   Kem Parkinson, PA-C    Family History Family History  Problem Relation Age of Onset  . Colon cancer Neg Hx     Social History Social History  Substance Use Topics  . Smoking status: Current Every Day Smoker    Packs/day: 1.00    Types: Cigarettes  . Smokeless tobacco: Former Systems developer    Quit date: 10/01/1968  . Alcohol use 0.6 oz/week    1 Cans of beer per week     Allergies   Patient has no known allergies.   Review of Systems Review of Systems  Constitutional: Positive for chills and fever. Negative for activity change and appetite change.  HENT: Positive for congestion, rhinorrhea and sore throat. Negative for facial swelling and trouble swallowing.   Eyes: Negative for visual disturbance.  Respiratory: Positive for cough and wheezing. Negative for shortness of breath and stridor.   Gastrointestinal: Negative for abdominal pain, nausea and vomiting.  Genitourinary: Negative for difficulty urinating and dysuria.  Musculoskeletal: Positive for myalgias. Negative for neck pain and neck stiffness.  Skin: Negative.   Neurological: Negative for dizziness, weakness, numbness and headaches.  Hematological: Negative for adenopathy.  Psychiatric/Behavioral: Negative for confusion.  All other systems reviewed and are negative.    Physical Exam Updated Vital Signs BP 145/76   Pulse 62   Temp 98.2 F (36.8 C) (Oral)   Resp 20   Ht 6' (  1.829 m)   Wt 122.5 kg   SpO2 98%   BMI 36.62 kg/m   Physical Exam  Constitutional: He is oriented to person, place, and time. He appears well-developed and well-nourished. No distress.  HENT:  Head: Normocephalic and atraumatic.  Right Ear: Tympanic membrane and ear canal normal.  Left Ear: Tympanic membrane and ear canal normal.  Nose: Mucosal edema and rhinorrhea present.  Mouth/Throat: Uvula is midline and mucous membranes are normal. No trismus in the jaw. No uvula swelling. Posterior oropharyngeal erythema present. No  oropharyngeal exudate, posterior oropharyngeal edema or tonsillar abscesses.  Eyes: Conjunctivae are normal.  Neck: Normal range of motion and phonation normal. Neck supple. No Brudzinski's sign and no Kernig's sign noted.  Cardiovascular: Normal rate, regular rhythm and intact distal pulses.   No murmur heard. Pulmonary/Chest: Effort normal. No respiratory distress. He has wheezes. He has no rales.  Expiratory wheezes bilaterally.  No rales  Abdominal: Soft. He exhibits no distension. There is no tenderness. There is no rebound and no guarding.  Musculoskeletal: Normal range of motion. He exhibits no edema.  Lymphadenopathy:    He has no cervical adenopathy.  Neurological: He is alert and oriented to person, place, and time. He exhibits normal muscle tone. Coordination normal.  Skin: Skin is warm and dry. No rash noted.  Nursing note and vitals reviewed.    ED Treatments / Results  Labs (all labs ordered are listed, but only abnormal results are displayed) Labs Reviewed - No data to display  EKG  EKG Interpretation None       Radiology Dg Chest 2 View  Result Date: 11/14/2016 CLINICAL DATA:  Cough. EXAM: CHEST  2 VIEW COMPARISON:  04/15/2016.  09/09/2011. FINDINGS: Mediastinum and hilar structures normal. Lungs are clear of acute infiltrates. Mild chronic interstitial prominence. Heart size normal. No pleural effusion or pneumothorax. Degenerative changes thoracic spine. IMPRESSION: No acute cardiopulmonary disease. Electronically Signed   By: Marcello Moores  Register   On: 11/14/2016 15:11     Procedures Procedures (including critical care time)  Medications Ordered in ED Medications  ipratropium-albuterol (DUONEB) 0.5-2.5 (3) MG/3ML nebulizer solution 3 mL (3 mLs Nebulization Given 11/14/16 1621)  albuterol (PROVENTIL) (2.5 MG/3ML) 0.083% nebulizer solution 2.5 mg (2.5 mg Nebulization Given 11/14/16 1621)  albuterol (PROVENTIL HFA;VENTOLIN HFA) 108 (90 Base) MCG/ACT inhaler 2 puff  (2 puffs Inhalation Given 11/14/16 1649)     Initial Impression / Assessment and Plan / ED Course  I have reviewed the triage vital signs and the nursing notes.  Pertinent labs & imaging results that were available during my care of the patient were reviewed by me and considered in my medical decision making (see chart for details).     Vitals stable.  Non-toxic appearing.  No clinical signs of dehydration.  Lung sounds improved after neb. Sx's likely related to viral illness. Pt appears stable for d/c.  Return precautions given  Final Clinical Impressions(s) / ED Diagnoses   Final diagnoses:  Influenza-like illness  Cough    New Prescriptions Discharge Medication List as of 11/14/2016  4:44 PM    START taking these medications   Details  guaiFENesin-codeine (ROBITUSSIN AC) 100-10 MG/5ML syrup Take 10 mLs by mouth 3 (three) times daily as needed., Starting Wed 11/14/2016, Print    predniSONE (DELTASONE) 20 MG tablet Take 2 tablets (40 mg total) by mouth daily. For 5 days, Starting Wed 11/14/2016, [employer], [place] 11/18/16 [phone]  Sherwood Gambler, MD 11/23/16 [phone]

## 2016-11-30 ENCOUNTER — Ambulatory Visit: Payer: Commercial Managed Care - HMO | Admitting: Urology

## 2017-01-15 ENCOUNTER — Emergency Department (HOSPITAL_COMMUNITY)
Admission: EM | Admit: 2017-01-15 | Discharge: 2017-01-15 | Disposition: A | Discharge status: Home / Self Care | Payer: Commercial Managed Care - HMO | Attending: Emergency Medicine | Admitting: Emergency Medicine

## 2017-01-15 ENCOUNTER — Encounter (HOSPITAL_COMMUNITY): Payer: Self-pay | Admitting: Emergency Medicine

## 2017-01-15 DIAGNOSIS — I10 Essential (primary) hypertension: Secondary | ICD-10-CM | POA: Insufficient documentation

## 2017-01-15 DIAGNOSIS — F17210 Nicotine dependence, cigarettes, uncomplicated: Secondary | ICD-10-CM | POA: Insufficient documentation

## 2017-01-15 DIAGNOSIS — M542 Cervicalgia: Secondary | ICD-10-CM | POA: Diagnosis not present

## 2017-01-15 DIAGNOSIS — Z79899 Other long term (current) drug therapy: Secondary | ICD-10-CM | POA: Insufficient documentation

## 2017-01-15 MED ORDER — METHOCARBAMOL 500 MG PO TABS: 500.0000 mg | ORAL_TABLET | Freq: Three times a day (TID) | ORAL | 0 refills | Status: DC | PRN

## 2017-01-15 NOTE — ED Triage Notes (Signed)
Pt here for right neck pain x 1 week worse with movement and positioning

## 2017-01-15 NOTE — ED Provider Notes (Signed)
[facility] DEPT Provider Note   CSN: [id number] Arrival date & time: 01/15/17  9518     History   Chief Complaint Chief Complaint  Patient presents with  . Neck Pain    HPI Manuel Williams is a 61 y.o. male.  HPI Patient presents to the  emergency room with complaints of ongoing right lateral neck discomfort of the past week is worse with movement and turning his head towards the right.  He reports no ear pain or change in his hearing.  No dental pain or posterior pharyngeal pain.  Denies recent injury or trauma.  No heavy lifting states he awoke this way 1 week ago.  No skin changes or rash.  No fevers or chills.  No chest pain or shortness of breath.   Past Medical History:  Diagnosis Date  . High cholesterol   . Hypertension   . Prostate enlargement   . Sleep apnea    just diagnosed; will be getting cpap    There are no active problems to display for this patient.   Past Surgical History:  Procedure Laterality Date  . CYST REMOVAL NECK  1970       Home Medications    Prior to Admission medications   Medication Sig Start Date End Date Taking? Authorizing Provider  albuterol (PROVENTIL HFA;VENTOLIN HFA) 108 (90 Base) MCG/ACT inhaler Inhale 1-2 puffs into the lungs every 6 (six) hours as needed for wheezing or shortness of breath. 04/15/16   Nat Christen, MD  alfuzosin (UROXATRAL) 10 MG 24 hr tablet Take 10 mg by mouth daily with breakfast.    Historical Provider, MD  guaiFENesin-codeine (ROBITUSSIN AC) 100-10 MG/5ML syrup Take 10 mLs by mouth 3 (three) times daily as needed. 11/14/16   Tammy Triplett, PA-C  methocarbamol (ROBAXIN) 500 MG tablet Take 1 tablet (500 mg total) by mouth every 8 (eight) hours as needed for muscle spasms. 01/15/17   Jola Schmidt, MD  olmesartan-hydrochlorothiazide (BENICAR HCT) 40-12.5 MG tablet Take 1 tablet daily. 04/04/16   Historical Provider, MD  predniSONE (DELTASONE) 20 MG tablet Take 2 tablets (40 mg total) by mouth daily. For 5 days  11/14/16   Tammy Triplett, PA-C  tamsulosin (FLOMAX) 0.4 MG CAPS capsule Take 1 capsule daily. 04/04/16   Historical Provider, MD  Vitamin D, Ergocalciferol, (DRISDOL) 50000 units CAPS capsule Take 50,000 Units by mouth once a week. 10/09/15   Historical Provider, MD    Family History Family History  Problem Relation Age of Onset  . Colon cancer Neg Hx     Social History Social History  Substance Use Topics  . Smoking status: Current Every Day Smoker    Packs/day: 1.00    Types: Cigarettes  . Smokeless tobacco: Former Systems developer    Quit date: 10/01/1968  . Alcohol use 0.6 oz/week    1 Cans of beer per week     Allergies   Patient has no known allergies.   Review of Systems Review of Systems  All other systems reviewed and are negative.    Physical Exam Updated Vital Signs BP (!) 145/77 (BP Location: Right Arm)   Pulse 64   Temp 97.4 F (36.3 C) (Oral)   Resp 18   Ht 6' (1.829 m)   Wt 280 lb (127 kg)   SpO2 96%   BMI 37.97 kg/m   Physical Exam  Constitutional: He is oriented to person, place, and time. He appears well-developed and well-nourished.  HENT:  Head: Normocephalic.  Right TM normal.  No mastoid tenderness.  No acute dental infections noted.  Posterior pharynx normal.  Tolerating secretions.  Eyes: EOM are normal.  Neck: Normal range of motion. Neck supple. No tracheal deviation present. No thyromegaly present.  Tenderness of the right sternocleidomastoid muscle without significant spasm.  No overlying skin changes.  Pulmonary/Chest: Effort normal.  Abdominal: He exhibits no distension.  Musculoskeletal: Normal range of motion.  Lymphadenopathy:    He has no cervical adenopathy.  Neurological: He is alert and oriented to person, place, and time.  Psychiatric: He has a normal mood and affect.  Nursing note and vitals reviewed.    ED Treatments / Results  Labs (all labs ordered are listed, but only abnormal results are displayed) Labs Reviewed - No data  to display  EKG  EKG Interpretation None       Radiology No results found.  Procedures Procedures (including critical care time)  Medications Ordered in ED Medications - No data to display   Initial Impression / Assessment and Plan / ED Course  I have reviewed the triage vital signs and the nursing notes.  Pertinent labs & imaging results that were available during my care of the patient were reviewed by me and considered in my medical decision making (see chart for details).    The patient is overall well-appearing.  This seems to be strain of his right sternocleidomastoid muscle.  No indication for acute imaging.  Doubt carotid dissection.  Perfusing normally.  Home with heat and muscle relaxation   Final Clinical Impressions(s) / ED Diagnoses   Final diagnoses:  Acute neck pain    New Prescriptions New Prescriptions   METHOCARBAMOL (ROBAXIN) 500 MG TABLET    Take 1 tablet (500 mg total) by mouth every 8 (eight) hours as needed for muscle spasms.     Jola Schmidt, MD 01/15/17 [phone]

## 2017-02-15 ENCOUNTER — Ambulatory Visit: Payer: Commercial Managed Care - HMO | Admitting: Urology

## 2017-02-15 ENCOUNTER — Ambulatory Visit (INDEPENDENT_AMBULATORY_CARE_PROVIDER_SITE_OTHER): Payer: Commercial Managed Care - HMO | Admitting: Urology

## 2017-02-15 DIAGNOSIS — N401 Enlarged prostate with lower urinary tract symptoms: Secondary | ICD-10-CM

## 2017-02-15 DIAGNOSIS — R351 Nocturia: Secondary | ICD-10-CM | POA: Diagnosis not present

## 2017-02-15 DIAGNOSIS — N5201 Erectile dysfunction due to arterial insufficiency: Secondary | ICD-10-CM | POA: Diagnosis not present

## 2017-08-18 ENCOUNTER — Other Ambulatory Visit: Payer: Self-pay

## 2017-08-18 ENCOUNTER — Emergency Department (HOSPITAL_COMMUNITY)
Admission: EM | Admit: 2017-08-18 | Discharge: 2017-08-18 | Disposition: A | Discharge status: Home / Self Care | Payer: 59 | Source: Home / Self Care | Attending: Emergency Medicine | Admitting: Emergency Medicine

## 2017-08-18 ENCOUNTER — Emergency Department (HOSPITAL_COMMUNITY)
Admission: EM | Admit: 2017-08-18 | Discharge: 2017-08-18 | Disposition: A | Discharge status: Home / Self Care | Payer: 59 | Attending: Emergency Medicine | Admitting: Emergency Medicine

## 2017-08-18 ENCOUNTER — Encounter (HOSPITAL_COMMUNITY): Payer: Self-pay | Admitting: *Deleted

## 2017-08-18 ENCOUNTER — Encounter (HOSPITAL_COMMUNITY): Payer: Self-pay

## 2017-08-18 DIAGNOSIS — R339 Retention of urine, unspecified: Secondary | ICD-10-CM | POA: Insufficient documentation

## 2017-08-18 DIAGNOSIS — I10 Essential (primary) hypertension: Secondary | ICD-10-CM | POA: Insufficient documentation

## 2017-08-18 DIAGNOSIS — Z79899 Other long term (current) drug therapy: Secondary | ICD-10-CM

## 2017-08-18 DIAGNOSIS — R39198 Other difficulties with micturition: Secondary | ICD-10-CM | POA: Insufficient documentation

## 2017-08-18 DIAGNOSIS — R319 Hematuria, unspecified: Secondary | ICD-10-CM | POA: Insufficient documentation

## 2017-08-18 DIAGNOSIS — N401 Enlarged prostate with lower urinary tract symptoms: Secondary | ICD-10-CM | POA: Diagnosis not present

## 2017-08-18 DIAGNOSIS — F17210 Nicotine dependence, cigarettes, uncomplicated: Secondary | ICD-10-CM | POA: Insufficient documentation

## 2017-08-18 DIAGNOSIS — N390 Urinary tract infection, site not specified: Secondary | ICD-10-CM | POA: Diagnosis not present

## 2017-08-18 LAB — CBC WITH DIFFERENTIAL/PLATELET
BASOS ABS: 0 10*3/uL (ref 0.0–0.1)
BASOS PCT: 0 %
Eosinophils Absolute: 0.1 10*3/uL (ref 0.0–0.7)
Eosinophils Relative: 1 %
HEMATOCRIT: 46.2 % (ref 39.0–52.0)
HEMOGLOBIN: 15.3 g/dL (ref 13.0–17.0)
LYMPHS PCT: 17 %
Lymphs Abs: 1.5 10*3/uL (ref 0.7–4.0)
MCH: 31.2 pg (ref 26.0–34.0)
MCHC: 33.1 g/dL (ref 30.0–36.0)
MCV: 94.1 fL (ref 78.0–100.0)
MONO ABS: 1.6 10*3/uL — AB (ref 0.1–1.0)
MONOS PCT: 18 %
NEUTROS ABS: 5.7 10*3/uL (ref 1.7–7.7)
Neutrophils Relative %: 64 %
Platelets: 167 10*3/uL (ref 150–400)
RBC: 4.91 MIL/uL (ref 4.22–5.81)
RDW: 12.6 % (ref 11.5–15.5)
WBC: 8.9 10*3/uL (ref 4.0–10.5)

## 2017-08-18 LAB — BASIC METABOLIC PANEL
Anion gap: 8 (ref 5–15)
BUN: 18 mg/dL (ref 6–20)
CO2: 27 mmol/L (ref 22–32)
Calcium: 9.1 mg/dL (ref 8.9–10.3)
Chloride: 103 mmol/L (ref 101–111)
Creatinine, Ser: 1.15 mg/dL (ref 0.61–1.24)
GFR calc Af Amer: >60 mL/min (ref >60)
GFR calc non Af Amer: >60 mL/min (ref >60)
GLUCOSE: 124 mg/dL — AB (ref 65–99)
POTASSIUM: 3.7 mmol/L (ref 3.5–5.1)
Sodium: 138 mmol/L (ref 135–145)

## 2017-08-18 LAB — URINALYSIS, ROUTINE W REFLEX MICROSCOPIC
Bilirubin Urine: NEGATIVE
GLUCOSE, UA: NEGATIVE mg/dL
Ketones, ur: NEGATIVE mg/dL
Nitrite: POSITIVE — AB
PH: 5 (ref 5.0–8.0)
Protein, ur: NEGATIVE mg/dL
SQUAMOUS EPITHELIAL / LPF: NONE SEEN
Specific Gravity, Urine: 1.014 (ref 1.005–1.030)

## 2017-08-18 MED ORDER — CEFTRIAXONE SODIUM 1 G IJ SOLR
INTRAMUSCULAR | Status: AC
  Administered 2017-08-18: 12:00:00
  Filled 2017-08-18 (×2): qty 10

## 2017-08-18 MED ORDER — PHENAZOPYRIDINE HCL 100 MG PO TABS
200.0000 mg | ORAL_TABLET | Freq: Once | ORAL | Status: AC
  Administered 2017-08-18: 200 mg via ORAL
  Filled 2017-08-18: qty 2

## 2017-08-18 MED ORDER — DEXTROSE 5 % IV SOLN
2.0000 g | Freq: Once | INTRAVENOUS | Status: AC
  Administered 2017-08-18: 2 g via INTRAVENOUS

## 2017-08-18 MED ORDER — PHENAZOPYRIDINE HCL 200 MG PO TABS: 200.0000 mg | ORAL_TABLET | Freq: Three times a day (TID) | ORAL | 0 refills | Status: DC

## 2017-08-18 MED ORDER — CEPHALEXIN 500 MG PO CAPS
500.0000 mg | ORAL_CAPSULE | Freq: Four times a day (QID) | ORAL | 0 refills | Status: DC
Start: 2017-08-18 — End: 2021-03-09

## 2017-08-18 NOTE — Discharge Instructions (Signed)
Call your urologist on Monday to let them know you had a foley catheter placed in the emergency department.

## 2017-08-18 NOTE — ED Provider Notes (Signed)
[facility] EMERGENCY DEPARTMENT Provider Note   CSN: [id number] Arrival date & time: 08/18/17  1000     History   Chief Complaint Chief Complaint  Patient presents with  . Urinary Retention    HPI Manuel Williams is a 61 y.o. male.  Pt presents to the ED today unable to urinate.  The pt has a hx of bph, but has never been completely unable to urinate.  Pt c/o lower abdominal pressure.  Pt denies any f/c.  He is followed by [facility].      Past Medical History:  Diagnosis Date  . High cholesterol   . Hypertension   . Prostate enlargement   . Sleep apnea    just diagnosed; will be getting cpap    There are no active problems to display for this patient.   Past Surgical History:  Procedure Laterality Date  . CYST REMOVAL NECK  1970       Home Medications    Prior to Admission medications   Medication Sig Start Date End Date Taking? Authorizing Provider  albuterol (PROVENTIL HFA;VENTOLIN HFA) 108 (90 Base) MCG/ACT inhaler Inhale 1-2 puffs into the lungs every 6 (six) hours as needed for wheezing or shortness of breath. 04/15/16   Nat Christen, MD  alfuzosin (UROXATRAL) 10 MG 24 hr tablet Take 10 mg by mouth daily with breakfast.    [provider]  cephALEXin (KEFLEX) 500 MG capsule Take 1 capsule (500 mg total) 4 (four) times daily by mouth. 08/18/17   Isla Pence, MD  guaiFENesin-codeine (ROBITUSSIN AC) 100-10 MG/5ML syrup Take 10 mLs by mouth 3 (three) times daily as needed. 11/14/16   Triplett, Tammy, PA-C  methocarbamol (ROBAXIN) 500 MG tablet Take 1 tablet (500 mg total) by mouth every 8 (eight) hours as needed for muscle spasms. 01/15/17   Jola Schmidt, MD  olmesartan-hydrochlorothiazide (BENICAR HCT) 40-12.5 MG tablet Take 1 tablet daily. 04/04/16   [provider]  phenazopyridine (PYRIDIUM) 200 MG tablet Take 1 tablet (200 mg total) 3 (three) times daily by mouth. 08/18/17   Isla Pence, MD  predniSONE (DELTASONE) 20 MG tablet  Take 2 tablets (40 mg total) by mouth daily. For 5 days 11/14/16   Kem Parkinson, PA-C  tamsulosin (FLOMAX) 0.4 MG CAPS capsule Take 1 capsule daily. 04/04/16   [provider]  Vitamin D, Ergocalciferol, (DRISDOL) 50000 units CAPS capsule Take 50,000 Units by mouth once a week. 10/09/15   [provider]    Family History Family History  Problem Relation Age of Onset  . Colon cancer Neg Hx     Social History Social History   Tobacco Use  . Smoking status: Current Every Day Smoker    Packs/day: 1.00    Types: Cigarettes  . Smokeless tobacco: Former Systems developer    Quit date: 10/01/1968  Substance Use Topics  . Alcohol use: Yes    Alcohol/week: 0.6 oz    Types: 1 Cans of beer per week    Comment: occ  . Drug use: No     Allergies   Patient has no known allergies.   Review of Systems Review of Systems  Genitourinary: Positive for difficulty urinating.  All other systems reviewed and are negative.    Physical Exam Updated Vital Signs BP 121/67   Pulse 80   Temp 98.6 F (37 C) (Oral)   Resp 16   Ht 6' (1.829 m)   Wt 117.9 kg (260 lb)   SpO2 94%   BMI  35.26 kg/m   Physical Exam  Constitutional: He is oriented to person, place, and time. He appears well-developed and well-nourished.  HENT:  Head: Normocephalic and atraumatic.  Right Ear: External ear normal.  Left Ear: External ear normal.  Nose: Nose normal.  Mouth/Throat: Oropharynx is clear and moist.  Eyes: Conjunctivae and EOM are normal. Pupils are equal, round, and reactive to light.  Neck: Normal range of motion. Neck supple.  Cardiovascular: Normal rate, regular rhythm, normal heart sounds and intact distal pulses.  Pulmonary/Chest: Effort normal and breath sounds normal.  Abdominal: Soft. Bowel sounds are normal. There is tenderness in the suprapubic area.  Musculoskeletal: Normal range of motion.  Neurological: He is alert and oriented to person, place, and time.  Skin: Skin is warm and  dry. Capillary refill takes less than 2 seconds.  Psychiatric: He has a normal mood and affect. His behavior is normal. Judgment and thought content normal.  Nursing note and vitals reviewed.    ED Treatments / Results  Labs (all labs ordered are listed, but only abnormal results are displayed) Labs Reviewed  URINALYSIS, ROUTINE W REFLEX MICROSCOPIC - Abnormal; Notable for the following components:      Result Value   APPearance HAZY (*)    Hgb urine dipstick MODERATE (*)    Nitrite POSITIVE (*)    Leukocytes, UA SMALL (*)    Bacteria, UA FEW (*)    All other components within normal limits  URINE CULTURE    EKG  EKG Interpretation None       Radiology No results found.  Procedures Procedures (including critical care time)  Medications Ordered in ED Medications  cefTRIAXone (ROCEPHIN) 2 g in dextrose 5 % 50 mL IVPB (not administered)  phenazopyridine (PYRIDIUM) tablet 200 mg (not administered)     Initial Impression / Assessment and Plan / ED Course  I have reviewed the triage vital signs and the nursing notes.  Pertinent labs & imaging results that were available during my care of the patient were reviewed by me and considered in my medical decision making (see chart for details).  Pt feels much better after foley placement.  He does have an UTI which was treated prior to d/c with rocephin.  He will be sent home on keflex and pyridium.  He is given a leg bag and instructed to f/u with urology for foley removal.  He knows to return if worse.  Final Clinical Impressions(s) / ED Diagnoses   Final diagnoses:  Urinary retention  Lower urinary tract infectious disease  Benign prostatic hyperplasia with lower urinary tract symptoms, symptom details unspecified    ED Discharge Orders        Ordered    cephALEXin (KEFLEX) 500 MG capsule  4 times daily     08/18/17 1100    phenazopyridine (PYRIDIUM) 200 MG tablet  3 times daily     08/18/17 1100       Isla Pence, MD 08/18/17 1103

## 2017-08-18 NOTE — ED Notes (Signed)
Irrigated foley again per EDP request, no clots removed during irrigation, foley catheter draining correctly with no evidence of clots or occlusion

## 2017-08-18 NOTE — ED Triage Notes (Signed)
Pt reports has enlarged prostate and has had difficulty voiding since Wednesday.  Pt says only voids a few drops.  C/O lower abd pain.

## 2017-08-18 NOTE — ED Notes (Signed)
Changed foley bag to leg bag.  Pt and signigicant other verbalized understanding of care.  Pt has gross hematuria in foley bag.  EDp aware.

## 2017-08-18 NOTE — ED Triage Notes (Signed)
Pt had a foley placed today. Prior to leaving earlier to day pt was found to have blood in his foley bag. EDP was aware. Pt went home with the leg bag. The lady with the pt states that the leg bag completely filled with blood so she emptied it and placed the larger bag on. The lady with him states that she had to change larger foley bag because it was half way filled up. Pt here for a recheck.

## 2017-08-18 NOTE — ED Notes (Signed)
Foley catheter irrigated with sterile water, several med size clots irrigated from catheter, catheter appears to be draining correctly with decreased blood in urine since irrigation

## 2017-08-18 NOTE — ED Provider Notes (Signed)
[facility] EMERGENCY DEPARTMENT Provider Note   CSN: [id number] Arrival date & time: 08/18/17  2014     History   Chief Complaint Chief Complaint  Patient presents with  . Hematuria    HPI Manuel Williams is a 61 y.o. male.  HPI Patient presents with hematuria.  He was seen earlier this morning for urinary retention had a Foley catheter placed at that time.  Had relief of the urinary retention but did have some hematuria after it.  Has reported been bleeding at home until now.  Has had some blood clots and very red urine.  Still has been draining however.  No other bleeding.  States he feels as if he has to urinate but otherwise no abdominal pain. Past Medical History:  Diagnosis Date  . High cholesterol   . Hypertension   . Prostate enlargement   . Sleep apnea    just diagnosed; will be getting cpap    There are no active problems to display for this patient.   Past Surgical History:  Procedure Laterality Date  . CYST REMOVAL NECK  1970       Home Medications    Prior to Admission medications   Medication Sig Start Date End Date Taking? Authorizing Provider  albuterol (PROVENTIL HFA;VENTOLIN HFA) 108 (90 Base) MCG/ACT inhaler Inhale 1-2 puffs into the lungs every 6 (six) hours as needed for wheezing or shortness of breath. 04/15/16   Nat Christen, MD  cephALEXin (KEFLEX) 500 MG capsule Take 1 capsule (500 mg total) 4 (four) times daily by mouth. 08/18/17   Isla Pence, MD  olmesartan-hydrochlorothiazide (BENICAR HCT) 40-12.5 MG tablet Take 1 tablet daily. 04/04/16   [provider]  phenazopyridine (PYRIDIUM) 200 MG tablet Take 1 tablet (200 mg total) 3 (three) times daily by mouth. 08/18/17   Isla Pence, MD  tamsulosin (FLOMAX) 0.4 MG CAPS capsule Take 1 capsule daily. 04/04/16   [provider]  Vitamin D, Ergocalciferol, (DRISDOL) 50000 units CAPS capsule Take 50,000 Units by mouth once a week. 10/09/15   [provider]    Family  History Family History  Problem Relation Age of Onset  . Colon cancer Neg Hx     Social History Social History   Tobacco Use  . Smoking status: Current Every Day Smoker    Packs/day: 1.00    Types: Cigarettes  . Smokeless tobacco: Former Systems developer    Quit date: 10/01/1968  Substance Use Topics  . Alcohol use: Yes    Alcohol/week: 0.6 oz    Types: 1 Cans of beer per week    Comment: occ  . Drug use: No     Allergies   Patient has no known allergies.   Review of Systems Review of Systems  Constitutional: Negative for chills and fever.  Respiratory: Negative for shortness of breath.   Cardiovascular: Negative for chest pain.  Gastrointestinal: Negative for abdominal pain.  Genitourinary: Positive for difficulty urinating and hematuria.  Musculoskeletal: Negative for back pain.  Neurological: Negative for numbness.  Hematological: Negative for adenopathy.  Psychiatric/Behavioral: Negative for confusion.     Physical Exam Updated Vital Signs BP 101/63 (BP Location: Right Arm)   Pulse 71   Temp 97.8 F (36.6 C) (Oral)   Resp 17   Ht 6' (1.829 m)   Wt 117.9 kg (260 lb)   SpO2 94%   BMI 35.26 kg/m   Physical Exam  Constitutional: He appears well-developed.  HENT:  Head: Normocephalic.  Cardiovascular: Normal rate.  Pulmonary/Chest: Effort normal.  Abdominal: There is no tenderness.  Genitourinary:  Genitourinary Comments: Foley catheter in place with hematuria  Musculoskeletal: He exhibits no edema.  Neurological: He is alert.  Skin: Skin is warm. Capillary refill takes less than 2 seconds.     ED Treatments / Results  Labs (all labs ordered are listed, but only abnormal results are displayed) Labs Reviewed  BASIC METABOLIC PANEL - Abnormal; Notable for the following components:      Result Value   Glucose, Bld 124 (*)    All other components within normal limits  CBC WITH DIFFERENTIAL/PLATELET - Abnormal; Notable for the following components:    Monocytes Absolute 1.6 (*)    All other components within normal limits    EKG  EKG Interpretation None       Radiology No results found.  Procedures Procedures (including critical care time)  Medications Ordered in ED Medications - No data to display   Initial Impression / Assessment and Plan / ED Course  I have reviewed the triage vital signs and the nursing notes.  Pertinent labs & imaging results that were available during my care of the patient were reviewed by me and considered in my medical decision making (see chart for details).     Patient with hematuria with Foley catheter.  Catheter is placed earlier today.  Urine has cleared up somewhat with irrigation.  Will have follow-up with urology.  Already on antibiotics.  Hemoglobin is reassuring as is kidney function.  Final Clinical Impressions(s) / ED Diagnoses   Final diagnoses:  None    ED Discharge Orders    None       Davonna Belling, MD 08/18/17 2241

## 2017-08-21 LAB — URINE CULTURE: Culture: >=100000 — AB

## 2017-08-22 ENCOUNTER — Telehealth: Payer: Self-pay | Admitting: *Deleted

## 2017-08-22 NOTE — Telephone Encounter (Signed)
Post ED Visit - Positive Culture Follow-up  Culture report reviewed by antimicrobial stewardship pharmacist:  []  Elenor Quinones, Pharm.D. []  Heide Guile, Pharm.D., BCPS AQ-ID []  Parks Neptune, Pharm.D., BCPS []  Alycia Rossetti, Pharm.D., BCPS []  [place], [place].D., BCPS, AAHIVP []  Legrand Como, Pharm.D., BCPS, AAHIVP [x]  Salome Arnt, PharmD, BCPS []  Dimitri Ped, PharmD, BCPS []  Vincenza Hews, PharmD, BCPS  Positive urine culture Treated with Cephalexin, organism sensitive to the same and no further patient follow-up is required at this time.  Harlon Flor [facility] 08/22/2017, 2:12 PM

## 2018-05-18 ENCOUNTER — Emergency Department (HOSPITAL_COMMUNITY)
Admission: EM | Admit: 2018-05-18 | Discharge: 2018-05-18 | Disposition: A | Discharge status: Home / Self Care | Payer: 59 | Attending: Emergency Medicine | Admitting: Emergency Medicine

## 2018-05-18 ENCOUNTER — Other Ambulatory Visit: Payer: Self-pay

## 2018-05-18 ENCOUNTER — Encounter (HOSPITAL_COMMUNITY): Payer: Self-pay | Admitting: *Deleted

## 2018-05-18 DIAGNOSIS — Z79899 Other long term (current) drug therapy: Secondary | ICD-10-CM | POA: Insufficient documentation

## 2018-05-18 DIAGNOSIS — F17210 Nicotine dependence, cigarettes, uncomplicated: Secondary | ICD-10-CM | POA: Diagnosis not present

## 2018-05-18 DIAGNOSIS — I10 Essential (primary) hypertension: Secondary | ICD-10-CM | POA: Insufficient documentation

## 2018-05-18 DIAGNOSIS — L03115 Cellulitis of right lower limb: Secondary | ICD-10-CM | POA: Diagnosis not present

## 2018-05-18 DIAGNOSIS — L03119 Cellulitis of unspecified part of limb: Secondary | ICD-10-CM

## 2018-05-18 DIAGNOSIS — G4730 Sleep apnea, unspecified: Secondary | ICD-10-CM | POA: Insufficient documentation

## 2018-05-18 DIAGNOSIS — L989 Disorder of the skin and subcutaneous tissue, unspecified: Secondary | ICD-10-CM | POA: Diagnosis present

## 2018-05-18 MED ORDER — MUPIROCIN CALCIUM 2 % EX CREA
TOPICAL_CREAM | Freq: Two times a day (BID) | CUTANEOUS | Status: DC
  Administered 2018-05-18: 18:00:00 via TOPICAL
  Filled 2018-05-18: qty 15

## 2018-05-18 NOTE — ED Provider Notes (Signed)
[facility] EMERGENCY DEPARTMENT Provider Note   CSN: [id number] Arrival date & time: 05/18/18  1716     History   Chief Complaint Chief Complaint  Patient presents with  . Insect Bite    HPI Manuel Williams is a 62 y.o. male.  HPI Patient presents with a foot injury 3 days ago.  States he was cleaning out a wood pile behind his garage when he felt something sharp in his foot.  Since then he has had increasing pain and redness of it.  States the wound is gotten larger.  No fevers or chills.  He is worried that it may be a spider bite.  He is otherwise healthy. Past Medical History:  Diagnosis Date  . High cholesterol   . Hypertension   . Prostate enlargement     Patient Active Problem List   Diagnosis Date Noted  . Sleep apnea     Past Surgical History:  Procedure Laterality Date  . CYST REMOVAL NECK  1970        Home Medications    Prior to Admission medications   Medication Sig Start Date End Date Taking? Authorizing Provider  albuterol (PROVENTIL HFA;VENTOLIN HFA) 108 (90 Base) MCG/ACT inhaler Inhale 1-2 puffs into the lungs every 6 (six) hours as needed for wheezing or shortness of breath. 04/15/16   Nat Christen, MD  cephALEXin (KEFLEX) 500 MG capsule Take 1 capsule (500 mg total) 4 (four) times daily by mouth. 08/18/17   Isla Pence, MD  olmesartan-hydrochlorothiazide (BENICAR HCT) 40-12.5 MG tablet Take 1 tablet daily. 04/04/16   [provider]  phenazopyridine (PYRIDIUM) 200 MG tablet Take 1 tablet (200 mg total) 3 (three) times daily by mouth. 08/18/17   Isla Pence, MD  tamsulosin (FLOMAX) 0.4 MG CAPS capsule Take 1 capsule by mouth twice a day 04/04/16   [provider]    Family History Family History  Problem Relation Age of Onset  . Colon cancer Neg Hx     Social History Social History   Tobacco Use  . Smoking status: Current Every Day Smoker    Packs/day: 1.00    Types: Cigarettes  . Smokeless tobacco: Former Systems developer   Quit date: 10/01/1968  Substance Use Topics  . Alcohol use: Yes    Alcohol/week: 1.0 standard drinks    Types: 1 Cans of beer per week    Comment: occ  . Drug use: No     Allergies   Patient has no known allergies.   Review of Systems Review of Systems  Constitutional: Negative for appetite change, chills and fever.  Respiratory: Negative for shortness of breath.   Cardiovascular: Negative for chest pain.  Musculoskeletal: Negative for back pain.  Skin: Positive for wound.  Neurological: Negative for weakness and numbness.     Physical Exam Updated Vital Signs BP 118/70   Pulse 71   Temp (!) 97.5 F (36.4 C) (Oral)   Resp 16   Ht 6' (1.829 m)   Wt 117.9 kg   SpO2 98%   BMI 35.26 kg/m   Physical Exam  Constitutional: He appears well-developed.  Cardiovascular: Normal rate.  Skin: Capillary refill takes less than 2 seconds.  Dorsum of right foot has painful lesion.  Some surrounding erythema.  Approximately 2 x 1 cm lesion with surrounding erythema.       ED Treatments / Results  Labs (all labs ordered are listed, but only abnormal results are displayed) Labs Reviewed - No data to display  EKG None  Radiology No results found.  Procedures Procedures (including critical care time)  Medications Ordered in ED Medications  mupirocin cream (BACTROBAN) 2 % (has no administration in time range)     Initial Impression / Assessment and Plan / ED Course  I have reviewed the triage vital signs and the nursing notes.  Pertinent labs & imaging results that were available during my care of the patient were reviewed by me and considered in my medical decision making (see chart for details).     Patient with wound on foot.  May have a superficial cellulitis but also may have started out as some sort of bite or sting.  Think patient could benefit from topical antibiotics.  Will discharge home and if worsens may need more follow-up. Final Clinical Impressions(s)  / ED Diagnoses   Final diagnoses:  Cellulitis of foot    ED Discharge Orders    None       Davonna Belling, MD 05/18/18 1751

## 2018-05-18 NOTE — ED Triage Notes (Addendum)
Pt c/o bite to top of right foot 2 days ago. Pt reports he felt something bite him but he didn't know what had bitten him. PT concerned it may have been a spider. Pt has some redness around the bite site.

## 2018-05-18 NOTE — Discharge Instructions (Addendum)
You likely either have a bite of some kind or may be a cellulitis.  Take the topical antibiotics.  Return here in a couple days or follow-up with your PCP if symptoms worsen.

## 2019-05-15 ENCOUNTER — Other Ambulatory Visit: Payer: Self-pay

## 2019-05-15 DIAGNOSIS — Z20822 Contact with and (suspected) exposure to covid-19: Secondary | ICD-10-CM

## 2019-05-17 LAB — NOVEL CORONAVIRUS, NAA: SARS-CoV-2, NAA: NOT DETECTED

## 2019-05-20 ENCOUNTER — Telehealth: Payer: Self-pay | Admitting: General Practice

## 2019-05-20 NOTE — Telephone Encounter (Signed)
Negative COVID results given. Patient results "NOT Detected." Caller expressed understanding. ° °

## 2019-08-31 ENCOUNTER — Encounter: Payer: Self-pay | Admitting: Gastroenterology

## 2020-09-11 ENCOUNTER — Encounter (HOSPITAL_COMMUNITY): Payer: Self-pay | Admitting: Emergency Medicine

## 2020-09-11 ENCOUNTER — Other Ambulatory Visit: Payer: Self-pay

## 2020-09-11 ENCOUNTER — Emergency Department (HOSPITAL_COMMUNITY)
Admission: EM | Admit: 2020-09-11 | Discharge: 2020-09-11 | Disposition: A | Discharge status: Home / Self Care | Payer: 59 | Attending: Emergency Medicine | Admitting: Emergency Medicine

## 2020-09-11 DIAGNOSIS — R300 Dysuria: Secondary | ICD-10-CM | POA: Diagnosis present

## 2020-09-11 DIAGNOSIS — I10 Essential (primary) hypertension: Secondary | ICD-10-CM | POA: Diagnosis not present

## 2020-09-11 DIAGNOSIS — Z79899 Other long term (current) drug therapy: Secondary | ICD-10-CM | POA: Diagnosis not present

## 2020-09-11 DIAGNOSIS — F17210 Nicotine dependence, cigarettes, uncomplicated: Secondary | ICD-10-CM | POA: Insufficient documentation

## 2020-09-11 DIAGNOSIS — N3001 Acute cystitis with hematuria: Secondary | ICD-10-CM | POA: Diagnosis not present

## 2020-09-11 LAB — URINALYSIS, ROUTINE W REFLEX MICROSCOPIC
Bilirubin Urine: NEGATIVE
Glucose, UA: NEGATIVE mg/dL
Ketones, ur: NEGATIVE mg/dL
Nitrite: POSITIVE — AB
Protein, ur: 100 mg/dL — AB
RBC / HPF: >50 RBC/hpf — ABNORMAL HIGH (ref 0–5)
Specific Gravity, Urine: 1.016 (ref 1.005–1.030)
WBC, UA: >50 WBC/hpf — ABNORMAL HIGH (ref 0–5)
pH: 9 — ABNORMAL HIGH (ref 5.0–8.0)

## 2020-09-11 NOTE — ED Triage Notes (Signed)
Patient c/o dysuria that started yesterday with frequency and decrease in flow. Per patient seen at Urgent Care this morning and diagnosed with UTI. Patient given antibiotics in which he took one dose. Patient concerned about possible kidney stone. Denies any flank, back, or abd pain.

## 2020-09-11 NOTE — ED Provider Notes (Signed)
[facility] EMERGENCY DEPARTMENT Provider Note   CSN: [id number] Arrival date & time: 09/11/20  1403     History Chief Complaint  Patient presents with  . Dysuria    Manuel Williams is a 64 y.o. male with a past medical history significant for hyperlipidemia, hypertension, and enlarged prostate who presents to the ED due to continued dysuria and urinary frequency x 1 day.  Patient was evaluated at urgent care prior to arrival and diagnosed with acute cystitis and was prescribed Bactrim. Patient states he took 1 dose however, he has not had improvement in symptoms.  Patient admits to persistent dysuria.  Denies penile discharge.  No concern for STDs at this time.  Patient reports to the ED due to concerns about a possible kidney stone.  Patient denies any abdominal pain, flank pain, back pain, and hematuria. No history of previous kidney stone.  Denies fever and chills.  Denies testicular tenderness and edema.  History obtained from patient and past medical records. No interpreter used during encounter.      Past Medical History:  Diagnosis Date  . High cholesterol   . Hypertension   . Prostate enlargement     Patient Active Problem List   Diagnosis Date Noted  . Sleep apnea     Past Surgical History:  Procedure Laterality Date  . CYST REMOVAL NECK  1970       Family History  Problem Relation Age of Onset  . Colon cancer Neg Hx     Social History   Tobacco Use  . Smoking status: Current Every Day Smoker    Packs/day: 1.00    Types: Cigarettes  . Smokeless tobacco: Former Systems developer    Quit date: 10/01/1968  Vaping Use  . Vaping Use: Never used  Substance Use Topics  . Alcohol use: Yes    Alcohol/week: 1.0 standard drink    Types: 1 Cans of beer per week    Comment: occ  . Drug use: No    Home Medications Prior to Admission medications   Medication Sig Start Date End Date Taking? Authorizing Provider  albuterol (PROVENTIL HFA;VENTOLIN HFA) 108 (90 Base) MCG/ACT  inhaler Inhale 1-2 puffs into the lungs every 6 (six) hours as needed for wheezing or shortness of breath. 04/15/16   Nat Christen, MD  cephALEXin (KEFLEX) 500 MG capsule Take 1 capsule (500 mg total) 4 (four) times daily by mouth. 08/18/17   Isla Pence, MD  olmesartan-hydrochlorothiazide (BENICAR HCT) 40-12.5 MG tablet Take 1 tablet daily. 04/04/16   [provider]  phenazopyridine (PYRIDIUM) 200 MG tablet Take 1 tablet (200 mg total) 3 (three) times daily by mouth. 08/18/17   Isla Pence, MD  tamsulosin (FLOMAX) 0.4 MG CAPS capsule Take 1 capsule by mouth twice a day 04/04/16   [provider]    Allergies    Patient has no known allergies.  Review of Systems   Review of Systems  Constitutional: Negative for chills and fever.  Gastrointestinal: Negative for abdominal pain, diarrhea, nausea and vomiting.  Genitourinary: Positive for decreased urine volume, difficulty urinating and dysuria. Negative for flank pain, hematuria, penile discharge, penile swelling, scrotal swelling and testicular pain.  Musculoskeletal: Negative for back pain.  All other systems reviewed and are negative.   Physical Exam Updated Vital Signs BP (!) 141/69 (BP Location: Right Arm)   Pulse 72   Temp 98.2 F (36.8 C) (Oral)   Resp 18   Ht 6\' 1"  (1.854 m)   Wt 122.5  kg   SpO2 96%   BMI 35.62 kg/m   Physical Exam Vitals and nursing note reviewed.  Constitutional:      General: He is not in acute distress.    Appearance: He is not ill-appearing.  HENT:     Head: Normocephalic.  Eyes:     Pupils: Pupils are equal, round, and reactive to light.  Cardiovascular:     Rate and Rhythm: Normal rate and regular rhythm.     Pulses: Normal pulses.     Heart sounds: Normal heart sounds. No murmur heard. No friction rub. No gallop.   Pulmonary:     Effort: Pulmonary effort is normal.     Breath sounds: Normal breath sounds.  Abdominal:     General: Abdomen is flat. Bowel sounds are  normal. There is no distension.     Palpations: Abdomen is soft.     Tenderness: There is no abdominal tenderness. There is no right CVA tenderness, left CVA tenderness, guarding or rebound.     Comments: Negative bilateral CVA tenderness.  Musculoskeletal:     Cervical back: Neck supple.     Comments: Able to move all 4 extremities without difficulty.  Skin:    General: Skin is warm and dry.  Neurological:     General: No focal deficit present.     Mental Status: He is alert.  Psychiatric:        Mood and Affect: Mood normal.        Behavior: Behavior normal.     ED Results / Procedures / Treatments   Labs (all labs ordered are listed, but only abnormal results are displayed) Labs Reviewed  URINALYSIS, ROUTINE W REFLEX MICROSCOPIC - Abnormal; Notable for the following components:      Result Value   APPearance TURBID (*)    pH 9.0 (*)    Hgb urine dipstick SMALL (*)    Protein, ur 100 (*)    Nitrite POSITIVE (*)    Leukocytes,Ua LARGE (*)    RBC / HPF >50 (*)    WBC, UA >50 (*)    Bacteria, UA MANY (*)    All other components within normal limits  URINE CULTURE    EKG None  Radiology No results found.  Procedures Procedures (including critical care time)  Medications Ordered in ED Medications - No data to display  ED Course  I have reviewed the triage vital signs and the nursing notes.  Pertinent labs & imaging results that were available during my care of the patient were reviewed by me and considered in my medical decision making (see chart for details).  Clinical Course as of 09/11/20 1527  Sun Sep 11, 2020  1449 Nitrite(!): POSITIVE [CA]  1512 Leukocytes,Ua(!): LARGE [CA]  1512 Bacteria, UA(!): MANY [CA]    Clinical Course User Index [CA] Suzy Bouchard, PA-C   MDM Rules/Calculators/A&P                         64 year old male presents to the ED due to dysuria and increased urinary frequency x1 day.  Patient was seen at urgent care earlier  this morning and diagnosed with acute cystitis and prescribed Bactrim.  Patient states he took 1 dose, but reports to the ED due to continued symptoms.  Patient denies fever, chills, flank pain, back pain, and abdominal pain.  Upon arrival, patient is afebrile, not tachycardic or hypoxic.  Patient in no acute distress and nontoxic-appearing.  Physical  exam reassuring.  Abdomen soft, nondistended, nontender.  Negative CVA tenderness bilaterally.  Low suspicion for pyelonephritis and kidney stones given no pain.  UA and urine culture ordered at triage.  UA significant for large leukocytes and positive nitrates.  Many bacteria and small amount of hematuria. Urine culture pending. Suspect symptoms related to acute cystitis. I had a long discussion with patient that it will take some time for symptomatic relief after starting the antibiotics.  Encouraged patient to continue the antibiotic. Good oral hydration was discussed. Strict ED precautions discussed with patient. Patient states understanding and agrees to plan. Patient discharged home in no acute distress and stable vitals  Discussed case with Dr. Sabra Heck who agrees with assessment and plan.  Final Clinical Impression(s) / ED Diagnoses Final diagnoses:  Acute cystitis with hematuria    Rx / DC Orders ED Discharge Orders    None       Karie Kirks 09/11/20 1527    Noemi Chapel, MD 09/12/20 1556

## 2020-09-11 NOTE — Discharge Instructions (Addendum)
As discussed, your urine showed a urinary tract infection.  Continue taking the antibiotic that urgent care prescribed you earlier today.  He will take 24 to 48 hours to see symptomatic improvement.  Please follow-up with PCP within the next week if symptoms not improved.  Return to the ER for new or worsening symptoms.

## 2020-09-13 LAB — URINE CULTURE: Culture: >=100000 — AB

## 2021-03-09 ENCOUNTER — Ambulatory Visit: Payer: 59 | Admitting: Cardiology

## 2021-03-09 ENCOUNTER — Other Ambulatory Visit: Payer: Self-pay

## 2021-03-09 ENCOUNTER — Encounter: Payer: Self-pay | Admitting: Cardiology

## 2021-03-09 VITALS — BP 129/78 | HR 58 | Temp 98.0°F | Resp 17 | Ht 73.0 in | Wt 265.6 lb

## 2021-03-09 DIAGNOSIS — I10 Essential (primary) hypertension: Secondary | ICD-10-CM

## 2021-03-09 DIAGNOSIS — G4730 Sleep apnea, unspecified: Secondary | ICD-10-CM

## 2021-03-09 DIAGNOSIS — I200 Unstable angina: Secondary | ICD-10-CM | POA: Diagnosis present

## 2021-03-09 DIAGNOSIS — F17200 Nicotine dependence, unspecified, uncomplicated: Secondary | ICD-10-CM

## 2021-03-09 DIAGNOSIS — R0609 Other forms of dyspnea: Secondary | ICD-10-CM

## 2021-03-09 DIAGNOSIS — Z8616 Personal history of COVID-19: Secondary | ICD-10-CM

## 2021-03-09 MED ORDER — NITROGLYCERIN 0.4 MG SL SUBL: 0.4000 mg | SUBLINGUAL_TABLET | SUBLINGUAL | 0 refills | Status: DC | PRN

## 2021-03-09 MED ORDER — ATORVASTATIN CALCIUM 40 MG PO TABS
40.0000 mg | ORAL_TABLET | Freq: Every day | ORAL | 0 refills | Status: DC
Start: 2021-03-09 — End: 2021-06-02

## 2021-03-09 NOTE — Progress Notes (Signed)
Date:  03/09/2021   ID:  Manuel Williams, DOB Apr 18, 1956, MRN [id number]  PCP:  Everardo Beals, NP  Cardiologist:  Rex Kras, DO, [facility] (established care 03/09/2021)  REASON FOR CONSULT: Shortness of Breath  REQUESTING PHYSICIAN:  Everardo Beals, [place]  [id number]   Chief Complaint  Patient presents with   Unstable angina   New Patient (Initial Visit)    Referred by Barbee Shropshire, NP    HPI  Manuel Williams is a 65 y.o. male who presents to the office with a chief complaint of " chest pressure and shortness of breath." Patient's past medical history and cardiovascular risk factors include: sleep apnea not on CPAP, smoker (1ppd for 40 years), hypertension, Hx fof COVID 19 infection, obesity due to excess calorie.   He is referred to the office at the request of Everardo Beals, NP for evaluation of shortness of breath.  Patient is accompanied by his wife at today's office visit.  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  The discomfort was activity limiting, he sat down and the symptoms improved after 15 minutes.  He continues to have some residual symptoms but not as severe as 2 weeks ago.  He denies any diaphoresis, nausea, and no radiation of pain.  Patient states that he decided not to go to the ER for reasons unknown.  He recently went to her primary care provider's office yesterday and also did not for stat cardiology evaluation.  Is now biggest concern is shortness of breath with effort related activities which is getting progressively worse.  He gets tired quicker to be seen activity.  He denies lower extremity swelling or orthopnea but does have PND.  Decrease physical endurance over the last 2 weeks.  No family history of premature coronary disease or sudden cardiac death.  FUNCTIONAL STATUS: No structured exercise  program or daily routine.  ALLERGIES: No Known Allergies  MEDICATION LIST PRIOR TO VISIT: Current Meds  Medication Sig   atorvastatin (LIPITOR) 40 MG tablet Take 1 tablet (40 mg total) by mouth at bedtime.   calcium-vitamin D (OSCAL WITH D) 500-200 MG-UNIT tablet Take 1 tablet by mouth.   finasteride (PROSCAR) 5 MG tablet Take 5 mg by mouth daily.   ibuprofen (ADVIL) 800 MG tablet Take 800 mg by mouth every 8 (eight) hours as needed.   meloxicam (MOBIC) 7.5 MG tablet Take 7.5 mg by mouth daily.   nitroGLYCERIN (NITROSTAT) 0.4 MG SL tablet Place 1 tablet (0.4 mg total) under the tongue every 5 (five) minutes as needed for chest pain. If you require more than two tablets five minutes apart go to the nearest ER via EMS.   olmesartan-hydrochlorothiazide (BENICAR HCT) 40-12.5 MG tablet Take 1 tablet daily.   tamsulosin (FLOMAX) 0.4 MG CAPS capsule Take 1 capsule by mouth twice a day     PAST MEDICAL HISTORY: Past Medical History:  Diagnosis Date   BPH (benign prostatic hyperplasia)    Hypertension    Prostate enlargement     PAST SURGICAL HISTORY: Past Surgical History:  Procedure Laterality Date   CYST REMOVAL NECK  1970    FAMILY HISTORY: The patient family history includes Colon cancer in his father; Dementia in his mother; Stroke in his father.  SOCIAL HISTORY:  The patient  reports that he has been smoking cigarettes. He has been smoking an  average of 1.00 packs per day. He quit smokeless tobacco use about 52 years ago.  His smokeless tobacco use included chew. He reports current alcohol use of about 1.0 standard drink of alcohol per week. He reports that he does not use drugs.  REVIEW OF SYSTEMS: Review of Systems  Constitutional: Positive for malaise/fatigue. Negative for chills and fever.  HENT:  Negative for hoarse voice and nosebleeds.   Eyes:  Negative for discharge, double vision and pain.  Cardiovascular:  Positive for chest pain, dyspnea on exertion and orthopnea.  Negative for claudication, leg swelling, near-syncope, palpitations, paroxysmal nocturnal dyspnea and syncope.  Respiratory:  Negative for hemoptysis and shortness of breath.   Musculoskeletal:  Negative for muscle cramps and myalgias.  Gastrointestinal:  Negative for abdominal pain, constipation, diarrhea, hematemesis, hematochezia, melena, nausea and vomiting.  Neurological:  Negative for dizziness and light-headedness.   PHYSICAL EXAM: Vitals with BMI 03/09/2021 09/11/2020 09/11/2020  Height $Remov'6\' 1"'kznmWN$  - $'6\' 1"'F$   Weight 265 lbs 10 oz - 270 lbs  BMI 39.03 - 00.92  Systolic 330 076 -  Diastolic 78 71 -  Pulse 58 65 -    CONSTITUTIONAL: Appears older than stated age, hemodynamically stable, no acute distress. SKIN: Skin is warm and dry. No rash noted. No cyanosis. No pallor. No jaundice HEAD: Normocephalic and atraumatic.  EYES: No scleral icterus MOUTH/THROAT: Moist oral membranes.  NECK: No JVD present. No thyromegaly noted. No carotid bruits  LYMPHATIC: No visible cervical adenopathy.  CHEST Normal respiratory effort. No intercostal retractions  LUNGS: Decreased breath sounds bilaterally with rales noted, no stridor or wheezing. CARDIOVASCULAR: Regular, positive S1-S2, no murmurs rubs or gallops appreciated. ABDOMINAL: Obese, soft, nontender, nondistended, positive bowel sounds in all 4 quadrants, no apparent ascites.  EXTREMITIES: Trace bilateral peripheral edema, 2+ bilateral dorsalis pedis pulses. HEMATOLOGIC: No significant bruising NEUROLOGIC: Oriented to person, place, and time. Nonfocal. Normal muscle tone.  PSYCHIATRIC: Normal mood and affect. Normal behavior. Cooperative  CARDIAC DATABASE: EKG: 03/09/2021: Sinus bradycardia, 56 bpm, without underlying ischemia or injury.  Echocardiogram: No results found for this or any previous visit from the past 1095 days.   Stress Testing: No results found for this or any previous visit from the past 1095 days.  Heart  Catheterization: None  LABORATORY DATA: CBC Latest Ref Rng & Units 08/18/2017 04/15/2016 12/05/2014  WBC 4.0 - 10.5 K/uL 8.9 9.9 7.0  Hemoglobin 13.0 - 17.0 g/dL 15.3 16.5 16.4  Hematocrit 39.0 - 52.0 % 46.2 48.7 49.1  Platelets 150 - 400 K/uL 167 210 154    CMP Latest Ref Rng & Units 08/18/2017 04/15/2016 12/05/2014  Glucose 65 - 99 mg/dL 124(H) 107(H) 124(H)  BUN 6 - 20 mg/dL 18 30(H) 20  Creatinine 0.61 - 1.24 mg/dL 1.15 1.11 0.88  Sodium 135 - 145 mmol/L 138 136 138  Potassium 3.5 - 5.1 mmol/L 3.7 4.0 4.2  Chloride 101 - 111 mmol/L 103 105 106  CO2 22 - 32 mmol/L $RemoveB'27 28 26  '[id number]$ Calcium 8.9 - 10.3 mg/dL 9.1 9.2 9.3    Lipid Panel  No results found for: CHOL, TRIG, HDL, CHOLHDL, VLDL, LDLCALC, LDLDIRECT, LABVLDL  No components found for: NTPROBNP No results for input(s): PROBNP in the last 8760 hours. No results for input(s): TSH in the last 8760 hours.  BMP No results for input(s): NA, K, CL, CO2, GLUCOSE, BUN, CREATININE, CALCIUM, GFRNONAA, GFRAA in the last 8760 hours.  HEMOGLOBIN A1C No results found for: HGBA1C, MPG  External Labs: Collected: 11/30/2019 records  provided by Primary care provider. Creatinine 1.32 mg/dL. eGFR: 57 mL/min per 1.73 m Sodium 137, potassium 4.1, chloride 103, bicarb 22, AST 17, ALT 21, alkaline phosphatase 55 Lipid profile: Total cholesterol 157, triglycerides 183, HDL 37, LDL 92 Hemoglobin A1c: 5.9 TSH: 1.14 Hemoglobin 16.8 g/dL, hematocrit 49.3%  IMPRESSION:    ICD-10-CM   1. Unstable angina (HCC)  I20.0 EKG 12-Lead    atorvastatin (LIPITOR) 40 MG tablet    nitroGLYCERIN (NITROSTAT) 0.4 MG SL tablet    CMP14+EGFR    CBC    Pro b natriuretic peptide (BNP)    Troponin T    2. Dyspnea on exertion  R06.00 atorvastatin (LIPITOR) 40 MG tablet    nitroGLYCERIN (NITROSTAT) 0.4 MG SL tablet    CMP14+EGFR    CBC    Pro b natriuretic peptide (BNP)    Troponin T    3. Benign hypertension  I10     4. Sleep apnea, unspecified type  G47.30      5. Smoking  F17.200     6. Class 2 severe obesity due to excess calories with serious comorbidity and body mass index (BMI) of 35.0 to 35.9 in adult (HCC)  E66.01    Z68.35     7. History of COVID-19  Z86.16        RECOMMENDATIONS: Manuel Williams is a 65 y.o. male whose past medical history and cardiac risk factors include: sleep apnea not on CPAP, smoker (1ppd for 40 years), hypertension, Hx fof COVID 19 infection, obesity due to excess calorie.   Unstable angina: He continues to have residual chest pressure with effort related activities; however, the initial event happened approximately 2 weeks ago.  He is now having progressive shortness of breath which prompted him to go to the PCPs office. Recommended a more urgent evaluation by going to the ER as a pretest probability of obstructive CAD is high.  However, patient would like to have this worked up outpatient.  He understands that if the symptoms increase in intensity, frequency, duration he will seek medical attention by going to the closest ER via EMS.  His wife is present during today's encounter as well. EKG shows sinus bradycardia without underlying injury pattern. Continue aspirin. Start statin therapy and sublingual nitroglycerin tablets to use on a as needed basis.  Patient is made aware of the risk of drug to drug interactions between nitro tablets and phosphodiesterase 5 inhibitors often used for BPH/erectile dysfunction. Check CMP, CBC, BMP, troponin levels. Patient is agreeable to schedule a left heart catheterization with possible intervention as outpatient.  The procedure of left heart catheterization with possible intervention was explained to the patient and his wife in detail. The indication, alternatives, risks & benefits were reviewed.  Complications include but not limited to bleeding, infection, vascular injury, stroke, myocardial infection, arrhythmia, kidney injury, radiation-related injury in the case of prolonged  fluoroscopy use, emergency cardiac surgery, and death. The patient understands the risks of serious complication is 1-2 in 0459 with diagnostic cardiac cath and 1-2% or less with angioplasty/stenting. The patient and his wife voices understanding and provide verbal feedback and wishes to proceed with coronary angiography with possible PCI.  Dyspnea on exertion: Continue hydrochlorothiazide.  Check BMP. Echocardiogram will be ordered to evaluate for structural heart disease and left ventricular systolic function. Ischemic evaluation as noted above  Sleep apnea: Not using his CPAP machine.  Recommended that he should be reevaluated.  Will defer to PCP at this time.  Active smoking:  1 pack/day for at least 40 years. Tobacco cessation counseling: Patient was informed of the dangers of tobacco abuse including stroke, cancer, and MI, as well as benefits of tobacco cessation. Patient is not willing to quit at this time. Approximately 7 mins were spent counseling patient cessation techniques. We discussed various methods to help quit smoking, including deciding on a date to quit, joining a support group, pharmacological agents- nicotine gum/patch/lozenges.  I will reassess his progress at the next follow-up visit  FINAL MEDICATION LIST END OF ENCOUNTER: Meds ordered this encounter  Medications   atorvastatin (LIPITOR) 40 MG tablet    Sig: Take 1 tablet (40 mg total) by mouth at bedtime.    Dispense:  90 tablet    Refill:  0   nitroGLYCERIN (NITROSTAT) 0.4 MG SL tablet    Sig: Place 1 tablet (0.4 mg total) under the tongue every 5 (five) minutes as needed for chest pain. If you require more than two tablets five minutes apart go to the nearest ER via EMS.    Dispense:  30 tablet    Refill:  0     Medications Discontinued During This Encounter  Medication Reason   ergocalciferol (VITAMIN D2) 1.25 MG (50000 UT) capsule Error   Acetaminophen-Codeine 300-30 MG tablet Error   albuterol (PROVENTIL  HFA;VENTOLIN HFA) 108 (90 Base) MCG/ACT inhaler Error   cephALEXin (KEFLEX) 500 MG capsule Error   Cholecalciferol 125 MCG (5000 UT) TABS Error   phenazopyridine (PYRIDIUM) 200 MG tablet Error     Current Outpatient Medications:    atorvastatin (LIPITOR) 40 MG tablet, Take 1 tablet (40 mg total) by mouth at bedtime., Disp: 90 tablet, Rfl: 0   calcium-vitamin D (OSCAL WITH D) 500-200 MG-UNIT tablet, Take 1 tablet by mouth., Disp: , Rfl:    finasteride (PROSCAR) 5 MG tablet, Take 5 mg by mouth daily., Disp: , Rfl:    ibuprofen (ADVIL) 800 MG tablet, Take 800 mg by mouth every 8 (eight) hours as needed., Disp: , Rfl:    meloxicam (MOBIC) 7.5 MG tablet, Take 7.5 mg by mouth daily., Disp: , Rfl:    nitroGLYCERIN (NITROSTAT) 0.4 MG SL tablet, Place 1 tablet (0.4 mg total) under the tongue every 5 (five) minutes as needed for chest pain. If you require more than two tablets five minutes apart go to the nearest ER via EMS., Disp: 30 tablet, Rfl: 0   olmesartan-hydrochlorothiazide (BENICAR HCT) 40-12.5 MG tablet, Take 1 tablet daily., Disp: , Rfl: 4   tamsulosin (FLOMAX) 0.4 MG CAPS capsule, Take 1 capsule by mouth twice a day, Disp: , Rfl: 12  Orders Placed This Encounter  Procedures   CMP14+EGFR   CBC   Pro b natriuretic peptide (BNP)   Troponin T   EKG 12-Lead    There are no Patient Instructions on file for this visit.   --Continue cardiac medications as reconciled in final medication list. --Return in about 2 weeks (around 03/23/2021) for chest pain, Dyspnea, Post heart catheterization. Or sooner if needed. --Continue follow-up with your primary care physician regarding the management of your other chronic comorbid conditions.  Patient's questions and concerns were addressed to his satisfaction. He voices understanding of the instructions provided during this encounter.   This note was created using a voice recognition software as a result there may be grammatical errors inadvertently  enclosed that do not reflect the nature of this encounter. Every attempt is made to correct such errors.  Total encounter time 65 minutes. Given his  symptoms, reviewing outside records including labs/chest x-ray/EKGs, and multiple cardiovascular risk factors recommended going to the ER for more expedited evaluation.  However, patient chooses to coordinate care on outpatient basis unless if he has worsening symptoms or labs are abnormal.  With regards to coordination of care, spoke to interventional cardiology and patient will be scheduled for a left heart catheterization tomorrow.  The plan of care was discussed with the patient and wife at today's visit.  Rex Kras, [place], [facility]  Pager: [phone] Office: [phone]

## 2021-03-10 ENCOUNTER — Ambulatory Visit: Payer: Self-pay | Admitting: Cardiology

## 2021-03-10 ENCOUNTER — Encounter (HOSPITAL_COMMUNITY)
Admission: RE | Disposition: A | Discharge status: Home / Self Care | Payer: Self-pay | Source: Home / Self Care | Attending: Cardiology

## 2021-03-10 ENCOUNTER — Ambulatory Visit (HOSPITAL_COMMUNITY)
Admission: RE | Admit: 2021-03-10 | Discharge: 2021-03-10 | Disposition: A | Discharge status: Home / Self Care | Payer: 59 | Attending: Cardiology | Admitting: Cardiology

## 2021-03-10 DIAGNOSIS — E785 Hyperlipidemia, unspecified: Secondary | ICD-10-CM | POA: Diagnosis not present

## 2021-03-10 DIAGNOSIS — G4730 Sleep apnea, unspecified: Secondary | ICD-10-CM | POA: Insufficient documentation

## 2021-03-10 DIAGNOSIS — Z006 Encounter for examination for normal comparison and control in clinical research program: Secondary | ICD-10-CM

## 2021-03-10 DIAGNOSIS — F17210 Nicotine dependence, cigarettes, uncomplicated: Secondary | ICD-10-CM | POA: Diagnosis not present

## 2021-03-10 DIAGNOSIS — Z6835 Body mass index (BMI) 35.0-35.9, adult: Secondary | ICD-10-CM | POA: Insufficient documentation

## 2021-03-10 DIAGNOSIS — R0609 Other forms of dyspnea: Secondary | ICD-10-CM | POA: Diagnosis not present

## 2021-03-10 DIAGNOSIS — I25110 Atherosclerotic heart disease of native coronary artery with unstable angina pectoris: Secondary | ICD-10-CM | POA: Insufficient documentation

## 2021-03-10 DIAGNOSIS — I200 Unstable angina: Secondary | ICD-10-CM | POA: Diagnosis present

## 2021-03-10 DIAGNOSIS — Z79899 Other long term (current) drug therapy: Secondary | ICD-10-CM | POA: Diagnosis not present

## 2021-03-10 DIAGNOSIS — I10 Essential (primary) hypertension: Secondary | ICD-10-CM | POA: Insufficient documentation

## 2021-03-10 DIAGNOSIS — Z8616 Personal history of COVID-19: Secondary | ICD-10-CM | POA: Insufficient documentation

## 2021-03-10 HISTORY — PX: LEFT HEART CATH AND CORONARY ANGIOGRAPHY: CATH118249

## 2021-03-10 LAB — CMP14+EGFR
ALT: 21 IU/L (ref 0–44)
AST: 17 IU/L (ref 0–40)
Albumin/Globulin Ratio: 1.7 (ref 1.2–2.2)
Albumin: 4.5 g/dL (ref 3.8–4.8)
Alkaline Phosphatase: 73 IU/L (ref 44–121)
BUN/Creatinine Ratio: 17 (ref 10–24)
BUN: 16 mg/dL (ref 8–27)
Bilirubin Total: 0.4 mg/dL (ref 0.0–1.2)
CO2: 21 mmol/L (ref 20–29)
Calcium: 9.3 mg/dL (ref 8.6–10.2)
Chloride: 105 mmol/L (ref 96–106)
Creatinine, Ser: 0.92 mg/dL (ref 0.76–1.27)
Globulin, Total: 2.6 g/dL (ref 1.5–4.5)
Glucose: 92 mg/dL (ref 65–99)
Potassium: 4.3 mmol/L (ref 3.5–5.2)
Sodium: 143 mmol/L (ref 134–144)
Total Protein: 7.1 g/dL (ref 6.0–8.5)
eGFR: 92 mL/min/{1.73_m2} (ref >59)

## 2021-03-10 LAB — CBC
Hematocrit: 47.2 % (ref 37.5–51.0)
Hemoglobin: 16.5 g/dL (ref 13.0–17.7)
MCH: 33.1 pg — ABNORMAL HIGH (ref 26.6–33.0)
MCHC: 35 g/dL (ref 31.5–35.7)
MCV: 95 fL (ref 79–97)
Platelets: 198 10*3/uL (ref 150–450)
RBC: 4.98 x10E6/uL (ref 4.14–5.80)
RDW: 12.1 % (ref 11.6–15.4)
WBC: 9.8 10*3/uL (ref 3.4–10.8)

## 2021-03-10 LAB — PRO B NATRIURETIC PEPTIDE: NT-Pro BNP: 122 pg/mL (ref 0–376)

## 2021-03-10 LAB — TROPONIN T: Troponin T (Highly Sensitive): 11 ng/L (ref 0–22)

## 2021-03-10 SURGERY — LEFT HEART CATH AND CORONARY ANGIOGRAPHY
Anesthesia: LOCAL

## 2021-03-10 MED ORDER — MIDAZOLAM HCL 2 MG/2ML IJ SOLN
INTRAMUSCULAR | Status: AC
  Filled 2021-03-10: qty 2

## 2021-03-10 MED ORDER — LABETALOL HCL 5 MG/ML IV SOLN: 10.0000 mg | INTRAVENOUS | Status: DC | PRN

## 2021-03-10 MED ORDER — ASPIRIN 81 MG PO CHEW: 81.0000 mg | CHEWABLE_TABLET | ORAL | Status: DC

## 2021-03-10 MED ORDER — SODIUM CHLORIDE 0.9% FLUSH: 3.0000 mL | INTRAVENOUS | Status: DC | PRN

## 2021-03-10 MED ORDER — VERAPAMIL HCL 2.5 MG/ML IV SOLN
INTRAVENOUS | Status: DC | PRN
  Administered 2021-03-10: 10 mL via INTRA_ARTERIAL

## 2021-03-10 MED ORDER — SODIUM CHLORIDE 0.9 % WEIGHT BASED INFUSION: 1.0000 mL/kg/h | INTRAVENOUS | Status: DC

## 2021-03-10 MED ORDER — SODIUM CHLORIDE 0.9 % WEIGHT BASED INFUSION
3.0000 mL/kg/h | INTRAVENOUS | Status: AC
  Administered 2021-03-10: 3 mL/kg/h via INTRAVENOUS

## 2021-03-10 MED ORDER — FENTANYL CITRATE (PF) 100 MCG/2ML IJ SOLN
INTRAMUSCULAR | Status: DC | PRN
  Administered 2021-03-10: 50 ug via INTRAVENOUS

## 2021-03-10 MED ORDER — SODIUM CHLORIDE 0.9 % IV SOLN: INTRAVENOUS | Status: AC

## 2021-03-10 MED ORDER — HEPARIN (PORCINE) IN NACL 1000-0.9 UT/500ML-% IV SOLN
INTRAVENOUS | Status: AC
  Filled 2021-03-10: qty 500

## 2021-03-10 MED ORDER — LIDOCAINE HCL (PF) 1 % IJ SOLN
INTRAMUSCULAR | Status: DC | PRN
  Administered 2021-03-10: 2 mL

## 2021-03-10 MED ORDER — ACETAMINOPHEN 325 MG PO TABS: 650.0000 mg | ORAL_TABLET | ORAL | Status: DC | PRN

## 2021-03-10 MED ORDER — ONDANSETRON HCL 4 MG/2ML IJ SOLN: 4.0000 mg | Freq: Four times a day (QID) | INTRAMUSCULAR | Status: DC | PRN

## 2021-03-10 MED ORDER — LIDOCAINE HCL (PF) 1 % IJ SOLN
INTRAMUSCULAR | Status: AC
  Filled 2021-03-10: qty 30

## 2021-03-10 MED ORDER — FENTANYL CITRATE (PF) 100 MCG/2ML IJ SOLN
INTRAMUSCULAR | Status: AC
  Filled 2021-03-10: qty 2

## 2021-03-10 MED ORDER — IOHEXOL 350 MG/ML SOLN
INTRAVENOUS | Status: DC | PRN
  Administered 2021-03-10: 20 mL

## 2021-03-10 MED ORDER — HEPARIN SODIUM (PORCINE) 1000 UNIT/ML IJ SOLN
INTRAMUSCULAR | Status: DC | PRN
  Administered 2021-03-10: 5000 [IU] via INTRAVENOUS

## 2021-03-10 MED ORDER — MIDAZOLAM HCL 2 MG/2ML IJ SOLN
INTRAMUSCULAR | Status: DC | PRN
  Administered 2021-03-10: 1 mg via INTRAVENOUS

## 2021-03-10 MED ORDER — SODIUM CHLORIDE 0.9 % IV SOLN: 250.0000 mL | INTRAVENOUS | Status: DC | PRN

## 2021-03-10 MED ORDER — VERAPAMIL HCL 2.5 MG/ML IV SOLN
INTRAVENOUS | Status: AC
  Filled 2021-03-10: qty 2

## 2021-03-10 MED ORDER — SODIUM CHLORIDE 0.9% FLUSH: 3.0000 mL | Freq: Two times a day (BID) | INTRAVENOUS | Status: DC

## 2021-03-10 MED ORDER — HEPARIN SODIUM (PORCINE) 1000 UNIT/ML IJ SOLN
INTRAMUSCULAR | Status: AC
  Filled 2021-03-10: qty 1

## 2021-03-10 MED ORDER — HEPARIN (PORCINE) IN NACL 1000-0.9 UT/500ML-% IV SOLN
INTRAVENOUS | Status: DC | PRN
  Administered 2021-03-10 (×2): 500 mL

## 2021-03-10 MED ORDER — HYDRALAZINE HCL 20 MG/ML IJ SOLN: 10.0000 mg | INTRAMUSCULAR | Status: DC | PRN

## 2021-03-10 SURGICAL SUPPLY — 10 items
CATH INFINITI JR4 5F (CATHETERS) ×1 IMPLANT
CATH OPTITORQUE TIG 4.0 5F (CATHETERS) ×1 IMPLANT
DEVICE RAD COMP TR BAND LRG (VASCULAR PRODUCTS) ×1 IMPLANT
GLIDESHEATH SLEND A-KIT 6F 22G (SHEATH) ×1 IMPLANT
GUIDEWIRE INQWIRE 1.5J.035X260 (WIRE) IMPLANT
INQWIRE 1.5J .035X260CM (WIRE) ×2
KIT HEART LEFT (KITS) ×2 IMPLANT
PACK CARDIAC CATHETERIZATION (CUSTOM PROCEDURE TRAY) ×2 IMPLANT
TRANSDUCER W/STOPCOCK (MISCELLANEOUS) ×2 IMPLANT
TUBING CIL FLEX 10 FLL-RA (TUBING) ×2 IMPLANT

## 2021-03-10 NOTE — Research (Signed)
IDENTIFY Informed Consent                  Subject Name: Manuel Williams. Manuel Williams   Subject met inclusion and exclusion criteria.  The informed consent form, study requirements and expectations were reviewed with the subject and questions and concerns were addressed prior to the signing of the consent form.  The subject verbalized understanding of the trial requirements.  The subject agreed to participate in the IDENTIFY trial and signed the informed consent at 12:34PM on 03/10/21.  The informed consent was obtained prior to performance of any protocol-specific procedures for the subject.  A copy of the signed informed consent was given to the subject and a copy was placed in the subject's medical record.   Meade Maw , Naval architect

## 2021-03-10 NOTE — Interval H&P Note (Signed)
History and Physical Interval Note:  03/10/2021 2:20 PM  Manuel Williams  has presented today for surgery, with the diagnosis of unstable angina - doe.  The various methods of treatment have been discussed with the patient and family. After consideration of risks, benefits and other options for treatment, the patient has consented to  Procedure(s): LEFT HEART CATH AND CORONARY ANGIOGRAPHY (N/A) as a surgical intervention.  The patient's history has been reviewed, patient examined, no change in status, stable for surgery.  I have reviewed the patient's chart and labs.  Questions were answered to the patient's satisfaction.    2016 Appropriate Use Criteria for Coronary Revascularization in Patients With Acute Coronary Syndrome NSTEMI/Unstable angina, stabilized patient at Intermediate Risk (TIMI Score 3-4) Indication:  Revascularization by PCI or CABG of 1 or more arteries in a patient with NSTEMI or unstable angina with Stabilization after presentation Intermediate risk for clinical events A (7) Indication: 16; Score 7 [place]

## 2021-03-10 NOTE — H&P (Signed)
OV 03/09/2021 copied for documentation    Date:  03/09/2021   ID:  Manuel Williams, DOB [birth year], MRN [id number]  PCP:  Everardo Beals, NP  Cardiologist:  Rex Kras, DO, [facility] (established care 03/09/2021)  REASON FOR CONSULT: Shortness of Breath  REQUESTING PHYSICIAN:  Everardo Beals, [place] [id number]   Chief Complaint  Patient presents with   Unstable angina   New Patient (Initial Visit)    Referred by Barbee Shropshire, NP    HPI  [name] is a 65 y.o. male who presents to the office with a chief complaint of " chest pressure and shortness of breath." Patient's past medical history and cardiovascular risk factors include: sleep apnea not on CPAP, smoker (1ppd for 40 years), hypertension, Hx fof COVID 19 infection, obesity due to excess calorie.   He is referred to the office at the request of Everardo Beals, NP for evaluation of shortness of breath.  Patient is accompanied by his wife at today's office visit.  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  The discomfort was activity limiting, he sat down and the symptoms improved after 15 minutes.  He continues to have some residual symptoms but not as severe as 2 weeks ago.  He denies any diaphoresis, nausea, and no radiation of pain.  Patient states that he decided not to go to the ER for reasons unknown.  He recently went to her primary care provider's office yesterday and also did not for stat cardiology evaluation.  Is now biggest concern is shortness of breath with effort related activities which is getting progressively worse.  He gets tired quicker to be seen activity.  He denies lower extremity swelling or orthopnea but does have PND.  Decrease physical endurance over the last 2 weeks.  No family history of premature coronary disease or sudden cardiac  death.  FUNCTIONAL STATUS: No structured exercise program or daily routine.  ALLERGIES: No Known Allergies  MEDICATION LIST PRIOR TO VISIT: Current Meds  Medication Sig   atorvastatin (LIPITOR) 40 MG tablet Take 1 tablet (40 mg total) by mouth at bedtime.   calcium-vitamin D (OSCAL WITH D) 500-200 MG-UNIT tablet Take 1 tablet by mouth.   finasteride (PROSCAR) 5 MG tablet Take 5 mg by mouth daily.   ibuprofen (ADVIL) 800 MG tablet Take 800 mg by mouth every 8 (eight) hours as needed.   meloxicam (MOBIC) 7.5 MG tablet Take 7.5 mg by mouth daily.   nitroGLYCERIN (NITROSTAT) 0.4 MG SL tablet Place 1 tablet (0.4 mg total) under the tongue every 5 (five) minutes as needed for chest pain. If you require more than two tablets five minutes apart go to the nearest ER via EMS.   olmesartan-hydrochlorothiazide (BENICAR HCT) 40-12.5 MG tablet Take 1 tablet daily.   tamsulosin (FLOMAX) 0.4 MG CAPS capsule Take 1 capsule by mouth twice a day     PAST MEDICAL HISTORY: Past Medical History:  Diagnosis Date   BPH (benign prostatic hyperplasia)    Hypertension    Prostate enlargement     PAST SURGICAL HISTORY: Past Surgical History:  Procedure Laterality Date   CYST REMOVAL NECK  1970    FAMILY HISTORY: The patient family history includes Colon cancer in his father; Dementia in his mother; Stroke in his father.  SOCIAL HISTORY:  The patient  reports that he has been  smoking cigarettes. He has been smoking an average of 1.00 packs per day. He quit smokeless tobacco use about 52 years ago.  His smokeless tobacco use included chew. He reports current alcohol use of about 1.0 standard drink of alcohol per week. He reports that he does not use drugs.  REVIEW OF SYSTEMS: Review of Systems  Constitutional: Positive for malaise/fatigue. Negative for chills and fever.  HENT:  Negative for hoarse voice and nosebleeds.   Eyes:  Negative for discharge, double vision and pain.  Cardiovascular:   Positive for chest pain, dyspnea on exertion and orthopnea. Negative for claudication, leg swelling, near-syncope, palpitations, paroxysmal nocturnal dyspnea and syncope.  Respiratory:  Negative for hemoptysis and shortness of breath.   Musculoskeletal:  Negative for muscle cramps and myalgias.  Gastrointestinal:  Negative for abdominal pain, constipation, diarrhea, hematemesis, hematochezia, melena, nausea and vomiting.  Neurological:  Negative for dizziness and light-headedness.   PHYSICAL EXAM: Vitals with BMI 03/09/2021 09/11/2020 09/11/2020  Height $Remov'6\' 1"'wXaKqh$  - $'6\' 1"'t$   Weight 265 lbs 10 oz - 270 lbs  BMI 88.89 - 16.94  Systolic 503 888 -  Diastolic 78 71 -  Pulse 58 65 -    CONSTITUTIONAL: Appears older than stated age, hemodynamically stable, no acute distress. SKIN: Skin is warm and dry. No rash noted. No cyanosis. No pallor. No jaundice HEAD: Normocephalic and atraumatic.  EYES: No scleral icterus MOUTH/THROAT: Moist oral membranes.  NECK: No JVD present. No thyromegaly noted. No carotid bruits  LYMPHATIC: No visible cervical adenopathy.  CHEST Normal respiratory effort. No intercostal retractions  LUNGS: Decreased breath sounds bilaterally with rales noted, no stridor or wheezing. CARDIOVASCULAR: Regular, positive S1-S2, no murmurs rubs or gallops appreciated. ABDOMINAL: Obese, soft, nontender, nondistended, positive bowel sounds in all 4 quadrants, no apparent ascites.  EXTREMITIES: Trace bilateral peripheral edema, 2+ bilateral dorsalis pedis pulses. HEMATOLOGIC: No significant bruising NEUROLOGIC: Oriented to person, place, and time. Nonfocal. Normal muscle tone.  PSYCHIATRIC: Normal mood and affect. Normal behavior. Cooperative  CARDIAC DATABASE: EKG: 03/09/2021: Sinus bradycardia, 56 bpm, without underlying ischemia or injury.  Echocardiogram: No results found for this or any previous visit from the past 1095 days.   Stress Testing: No results found for this or any previous  visit from the past 1095 days.  Heart Catheterization: None  LABORATORY DATA: CBC Latest Ref Rng & Units 08/18/2017 04/15/2016 12/05/2014  WBC 4.0 - 10.5 K/uL 8.9 9.9 7.0  Hemoglobin 13.0 - 17.0 g/dL 15.3 16.5 16.4  Hematocrit 39.0 - 52.0 % 46.2 48.7 49.1  Platelets 150 - 400 K/uL 167 210 154    CMP Latest Ref Rng & Units 08/18/2017 04/15/2016 12/05/2014  Glucose 65 - 99 mg/dL 124(H) 107(H) 124(H)  BUN 6 - 20 mg/dL 18 30(H) 20  Creatinine 0.61 - 1.24 mg/dL 1.15 1.11 0.88  Sodium 135 - 145 mmol/L 138 136 138  Potassium 3.5 - 5.1 mmol/L 3.7 4.0 4.2  Chloride 101 - 111 mmol/L 103 105 106  CO2 22 - 32 mmol/L $RemoveB'27 28 26  'uRjPpeFN$ Calcium 8.9 - 10.3 mg/dL 9.1 9.2 9.3    Lipid Panel  No results found for: CHOL, TRIG, HDL, CHOLHDL, VLDL, LDLCALC, LDLDIRECT, LABVLDL  No components found for: NTPROBNP No results for input(s): PROBNP in the last 8760 hours. No results for input(s): TSH in the last 8760 hours.  BMP No results for input(s): NA, K, CL, CO2, GLUCOSE, BUN, CREATININE, CALCIUM, GFRNONAA, GFRAA in the last 8760 hours.  HEMOGLOBIN A1C No results found for: HGBA1C,  MPG  External Labs: Collected: 11/30/2019 records provided by Primary care provider. Creatinine 1.32 mg/dL. eGFR: 57 mL/min per 1.73 m Sodium 137, potassium 4.1, chloride 103, bicarb 22, AST 17, ALT 21, alkaline phosphatase 55 Lipid profile: Total cholesterol 157, triglycerides 183, HDL 37, LDL 92 Hemoglobin A1c: 5.9 TSH: 1.14 Hemoglobin 16.8 g/dL, hematocrit 49.3%  IMPRESSION:    ICD-10-CM   1. Unstable angina (HCC)  I20.0 EKG 12-Lead    atorvastatin (LIPITOR) 40 MG tablet    nitroGLYCERIN (NITROSTAT) 0.4 MG SL tablet    CMP14+EGFR    CBC    Pro b natriuretic peptide (BNP)    Troponin T    2. Dyspnea on exertion  R06.00 atorvastatin (LIPITOR) 40 MG tablet    nitroGLYCERIN (NITROSTAT) 0.4 MG SL tablet    CMP14+EGFR    CBC    Pro b natriuretic peptide (BNP)    Troponin T    3. Benign hypertension  I10     4.  Sleep apnea, unspecified type  G47.30     5. Smoking  F17.200     6. Class 2 severe obesity due to excess calories with serious comorbidity and body mass index (BMI) of 35.0 to 35.9 in adult (HCC)  E66.01    Z68.35     7. History of COVID-19  Z86.16        RECOMMENDATIONS: RISHAB STOUDT is a 65 y.o. male whose past medical history and cardiac risk factors include: sleep apnea not on CPAP, smoker (1ppd for 40 years), hypertension, Hx fof COVID 19 infection, obesity due to excess calorie.   Unstable angina: He continues to have residual chest pressure with effort related activities; however, the initial event happened approximately 2 weeks ago.  He is now having progressive shortness of breath which prompted him to go to the PCPs office. Recommended a more urgent evaluation by going to the ER as a pretest probability of obstructive CAD is high.  However, patient would like to have this worked up outpatient.  He understands that if the symptoms increase in intensity, frequency, duration he will seek medical attention by going to the closest ER via EMS.  His wife is present during today's encounter as well. EKG shows sinus bradycardia without underlying injury pattern. Continue aspirin. Start statin therapy and sublingual nitroglycerin tablets to use on a as needed basis.  Patient is made aware of the risk of drug to drug interactions between nitro tablets and phosphodiesterase 5 inhibitors often used for BPH/erectile dysfunction. Check CMP, CBC, BMP, troponin levels. Patient is agreeable to schedule a left heart catheterization with possible intervention as outpatient.  The procedure of left heart catheterization with possible intervention was explained to the patient and his wife in detail. The indication, alternatives, risks & benefits were reviewed.  Complications include but not limited to bleeding, infection, vascular injury, stroke, myocardial infection, arrhythmia, kidney injury,  radiation-related injury in the case of prolonged fluoroscopy use, emergency cardiac surgery, and death. The patient understands the risks of serious complication is 1-2 in 7425 with diagnostic cardiac cath and 1-2% or less with angioplasty/stenting. The patient and his wife voices understanding and provide verbal feedback and wishes to proceed with coronary angiography with possible PCI.  Dyspnea on exertion: Continue hydrochlorothiazide.  Check BMP. Echocardiogram will be ordered to evaluate for structural heart disease and left ventricular systolic function. Ischemic evaluation as noted above  Sleep apnea: Not using his CPAP machine.  Recommended that he should be reevaluated.  Will defer to  PCP at this time.  Active smoking: 1 pack/day for at least 40 years. Tobacco cessation counseling: Patient was informed of the dangers of tobacco abuse including stroke, cancer, and MI, as well as benefits of tobacco cessation. Patient is not willing to quit at this time. Approximately 7 mins were spent counseling patient cessation techniques. We discussed various methods to help quit smoking, including deciding on a date to quit, joining a support group, pharmacological agents- nicotine gum/patch/lozenges.  I will reassess his progress at the next follow-up visit  FINAL MEDICATION LIST END OF ENCOUNTER: Meds ordered this encounter  Medications   atorvastatin (LIPITOR) 40 MG tablet    Sig: Take 1 tablet (40 mg total) by mouth at bedtime.    Dispense:  90 tablet    Refill:  0   nitroGLYCERIN (NITROSTAT) 0.4 MG SL tablet    Sig: Place 1 tablet (0.4 mg total) under the tongue every 5 (five) minutes as needed for chest pain. If you require more than two tablets five minutes apart go to the nearest ER via EMS.    Dispense:  30 tablet    Refill:  0     Medications Discontinued During This Encounter  Medication Reason   ergocalciferol (VITAMIN D2) 1.25 MG (50000 UT) capsule Error    Acetaminophen-Codeine 300-30 MG tablet Error   albuterol (PROVENTIL HFA;VENTOLIN HFA) 108 (90 Base) MCG/ACT inhaler Error   cephALEXin (KEFLEX) 500 MG capsule Error   Cholecalciferol 125 MCG (5000 UT) TABS Error   phenazopyridine (PYRIDIUM) 200 MG tablet Error     Current Outpatient Medications:    atorvastatin (LIPITOR) 40 MG tablet, Take 1 tablet (40 mg total) by mouth at bedtime., Disp: 90 tablet, Rfl: 0   calcium-vitamin D (OSCAL WITH D) 500-200 MG-UNIT tablet, Take 1 tablet by mouth., Disp: , Rfl:    finasteride (PROSCAR) 5 MG tablet, Take 5 mg by mouth daily., Disp: , Rfl:    ibuprofen (ADVIL) 800 MG tablet, Take 800 mg by mouth every 8 (eight) hours as needed., Disp: , Rfl:    meloxicam (MOBIC) 7.5 MG tablet, Take 7.5 mg by mouth daily., Disp: , Rfl:    nitroGLYCERIN (NITROSTAT) 0.4 MG SL tablet, Place 1 tablet (0.4 mg total) under the tongue every 5 (five) minutes as needed for chest pain. If you require more than two tablets five minutes apart go to the nearest ER via EMS., Disp: 30 tablet, Rfl: 0   olmesartan-hydrochlorothiazide (BENICAR HCT) 40-12.5 MG tablet, Take 1 tablet daily., Disp: , Rfl: 4   tamsulosin (FLOMAX) 0.4 MG CAPS capsule, Take 1 capsule by mouth twice a day, Disp: , Rfl: 12  Orders Placed This Encounter  Procedures   CMP14+EGFR   CBC   Pro b natriuretic peptide (BNP)   Troponin T   EKG 12-Lead    There are no Patient Instructions on file for this visit.   --Continue cardiac medications as reconciled in final medication list. --Return in about 2 weeks (around 03/23/2021) for chest pain, Dyspnea, Post heart catheterization. Or sooner if needed. --Continue follow-up with your primary care physician regarding the management of your other chronic comorbid conditions.  Patient's questions and concerns were addressed to his satisfaction. He voices understanding of the instructions provided during this encounter.   This note was created using a voice recognition  software as a result there may be grammatical errors inadvertently enclosed that do not reflect the nature of this encounter. Every attempt is made to correct such errors.  Total encounter time 65 minutes. Given his symptoms, reviewing outside records including labs/chest x-ray/EKGs, and multiple cardiovascular risk factors recommended going to the ER for more expedited evaluation.  However, patient chooses to coordinate care on outpatient basis unless if he has worsening symptoms or labs are abnormal.  With regards to coordination of care, spoke to interventional cardiology and patient will be scheduled for a left heart catheterization tomorrow.  The plan of care was discussed with the patient and wife at today's visit.  Rex Kras, [place], [facility]  Pager: [phone] Office: [phone]

## 2021-03-10 NOTE — Progress Notes (Signed)
03/10/2021: Glucose 92, BUN/Cr 16/0.92. EGFR 92. Na/K 143/4.3. Rest of the CMP normal H/H 16/47. MCV 95. Platelets 198 NT pro BNP 122

## 2021-03-13 ENCOUNTER — Encounter (HOSPITAL_COMMUNITY): Payer: Self-pay | Admitting: Cardiology

## 2021-03-27 ENCOUNTER — Encounter: Payer: Self-pay | Admitting: Cardiology

## 2021-03-27 ENCOUNTER — Ambulatory Visit: Payer: 59 | Admitting: Cardiology

## 2021-03-27 ENCOUNTER — Other Ambulatory Visit: Payer: Self-pay

## 2021-03-27 VITALS — BP 119/60 | HR 73 | Temp 98.3°F | Resp 16 | Ht 73.0 in | Wt 268.0 lb

## 2021-03-27 DIAGNOSIS — Z87891 Personal history of nicotine dependence: Secondary | ICD-10-CM

## 2021-03-27 DIAGNOSIS — R0609 Other forms of dyspnea: Secondary | ICD-10-CM

## 2021-03-27 DIAGNOSIS — Z712 Person consulting for explanation of examination or test findings: Secondary | ICD-10-CM

## 2021-03-27 DIAGNOSIS — I2510 Atherosclerotic heart disease of native coronary artery without angina pectoris: Secondary | ICD-10-CM

## 2021-03-27 DIAGNOSIS — Z8616 Personal history of COVID-19: Secondary | ICD-10-CM

## 2021-03-27 DIAGNOSIS — I10 Essential (primary) hypertension: Secondary | ICD-10-CM

## 2021-03-27 DIAGNOSIS — Z6835 Body mass index (BMI) 35.0-35.9, adult: Secondary | ICD-10-CM

## 2021-03-27 NOTE — Progress Notes (Signed)
Date:  03/27/2021   ID:  Manuel Williams, DOB 04-07-1956, MRN [id number]  PCP:  Everardo Beals, NP  Cardiologist:  Rex Kras, DO, [facility] (established care 03/09/2021)  Date: 03/27/21 Last Office Visit: 03/09/2021  Chief Complaint  Patient presents with   Post heart catheterization   Follow-up    HPI  ISACC Williams is a 65 y.o. male who presents to the office with a chief complaint of " post heart catheterization." Patient's past medical history and cardiovascular risk factors include: Nonobstructive CAD, sleep apnea not on CPAP, smoker (1ppd for 40 years), hypertension, Hx fof COVID 19 infection, obesity due to excess calorie.   He is referred to the office at the request of Everardo Beals, NP for evaluation of shortness of breath.  Patient presents for follow-up after recently being evaluated for chest pain and shortness of breath at the last office visit.  His symptoms are very concerning for unstable angina and due to continued symptoms had gone to the hospital for further evaluation and management.  He underwent left heart catheterization and was found to have nonobstructive CAD involving the LAD.  Since last office visit patient states that he stopped smoking and since then has felt significantly better.  He is less short of breath and no longer has chest discomfort.  Patient continues to have tired and fatigue but much better since last visit.  No family history of premature coronary disease or sudden cardiac death.  FUNCTIONAL STATUS: No structured exercise program or daily routine.  ALLERGIES: No Known Allergies  MEDICATION LIST PRIOR TO VISIT: Current Meds  Medication Sig   aspirin EC 81 MG tablet Take 81 mg by mouth daily. Swallow whole.   atorvastatin (LIPITOR) 40 MG tablet Take 1 tablet (40 mg total) by mouth at bedtime.   finasteride (PROSCAR) 5 MG tablet Take 5 mg by mouth daily.   nitroGLYCERIN (NITROSTAT) 0.4 MG SL tablet Place 1 tablet (0.4 mg total)  under the tongue every 5 (five) minutes as needed for chest pain. If you require more than two tablets five minutes apart go to the nearest ER via EMS.   olmesartan-hydrochlorothiazide (BENICAR HCT) 20-12.5 MG tablet Take 1 tablet by mouth daily.   tamsulosin (FLOMAX) 0.4 MG CAPS capsule Take 0.4 mg by mouth 2 (two) times daily.     PAST MEDICAL HISTORY: Past Medical History:  Diagnosis Date   BPH (benign prostatic hyperplasia)    Hypertension    Nonobstructive atherosclerosis of coronary artery    Prostate enlargement     PAST SURGICAL HISTORY: Past Surgical History:  Procedure Laterality Date   CYST REMOVAL NECK  1970   LEFT HEART CATH AND CORONARY ANGIOGRAPHY N/A 03/10/2021   Procedure: LEFT HEART CATH AND CORONARY ANGIOGRAPHY;  Surgeon: Nigel Mormon, MD;  Location: [facility] [place];  Service: Cardiovascular;  Laterality: N/A;    FAMILY HISTORY: The patient family history includes Colon cancer in his father; Dementia in his mother; Stroke in his father.  SOCIAL HISTORY:  The patient  reports that he has quit smoking. His smoking use included cigarettes. He smoked an average of 1.00 packs per day. He quit smokeless tobacco use about 52 years ago.  His smokeless tobacco use included chew. He reports current alcohol use of about 1.0 standard drink of alcohol per week. He reports that he does not use drugs.  REVIEW OF SYSTEMS: Review of Systems  Constitutional: Negative for chills, fever and malaise/fatigue.  HENT:  Negative for hoarse  voice and nosebleeds.   Eyes:  Negative for discharge, double vision and pain.  Cardiovascular:  Positive for dyspnea on exertion (improved.). Negative for chest pain, claudication, leg swelling, near-syncope, orthopnea, palpitations, paroxysmal nocturnal dyspnea and syncope.  Respiratory:  Negative for hemoptysis and shortness of breath.   Musculoskeletal:  Negative for muscle cramps and myalgias.  Gastrointestinal:  Negative for abdominal  pain, constipation, diarrhea, hematemesis, hematochezia, melena, nausea and vomiting.  Neurological:  Negative for dizziness and light-headedness.   PHYSICAL EXAM: Vitals with BMI 03/27/2021 03/10/2021 03/10/2021  Height _0  - -  Weight 268 lbs - -  BMI 04.54 - -  Systolic 098 119 147  Diastolic 60 65 67  Pulse 73 51 52    CONSTITUTIONAL: Appears older than stated age, hemodynamically stable, no acute distress. SKIN: Skin is warm and dry. No rash noted. No cyanosis. No pallor. No jaundice HEAD: Normocephalic and atraumatic.  EYES: No scleral icterus MOUTH/THROAT: Moist oral membranes.  NECK: No JVD present. No thyromegaly noted. No carotid bruits  LYMPHATIC: No visible cervical adenopathy.  CHEST Normal respiratory effort. No intercostal retractions  LUNGS: Decreased breath sounds bilaterally with rales noted, no stridor or wheezing.  CARDIOVASCULAR: Regular, positive S1-S2, no murmurs rubs or gallops appreciated. ABDOMINAL: Obese, soft, nontender, nondistended, positive bowel sounds in all 4 quadrants, no apparent ascites.  EXTREMITIES: Trace bilateral peripheral edema, 2+ bilateral dorsalis pedis pulses. HEMATOLOGIC: No significant bruising NEUROLOGIC: Oriented to person, place, and time. Nonfocal. Normal muscle tone.  PSYCHIATRIC: Normal mood and affect. Normal behavior. Cooperative  CARDIAC DATABASE: EKG: 03/09/2021: Sinus bradycardia, 56 bpm, without underlying ischemia or injury.  Echocardiogram: No results found for this or any previous visit from the past 1095 days.   Stress Testing: No results found for this or any previous visit from the past 1095 days.  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 No e/o prior MI, based on LV gram. Patient denies any chest pain at this time, but has exertional  dyspnea. Currently not on anti anginal therapy. Recommend medical therapy and risk factor modification. Recommend Aspirin 81 mg, management of hypertension, hyperlipidemia, as appropriate.  LABORATORY DATA: CBC Latest Ref Rng & Units 03/09/2021 08/18/2017 04/15/2016  WBC 3.4 - 10.8 x10E3/uL 9.8 8.9 9.9  Hemoglobin 13.0 - 17.7 g/dL 16.5 15.3 16.5  Hematocrit 37.5 - 51.0 % 47.2 46.2 48.7  Platelets 150 - 450 x10E3/uL 198 167 210    CMP Latest Ref Rng & Units 03/09/2021 08/18/2017 04/15/2016  Glucose 65 - 99 mg/dL 92 124(H) 107(H)  BUN 8 - 27 mg/dL 16 18 30(H)  Creatinine 0.76 - 1.27 mg/dL 0.92 1.15 1.11  Sodium 134 - 144 mmol/L 143 138 136  Potassium 3.5 - 5.2 mmol/L 4.3 3.7 4.0  Chloride 96 - 106 mmol/L 105 103 105  CO2 20 - 29 mmol/L _1 Calcium 8.6 - 10.2 mg/dL 9.3 9.1 9.2  Total Protein 6.0 - 8.5 g/dL 7.1 - -  Total Bilirubin 0.0 - 1.2 mg/dL 0.4 - -  Alkaline Phos 44 - 121 IU/L 73 - -  AST 0 - 40 IU/L 17 - -  ALT 0 - 44 IU/L 21 - -    Lipid Panel  No results found for: CHOL, TRIG, HDL, CHOLHDL, VLDL, LDLCALC, LDLDIRECT, LABVLDL  No components found for: NTPROBNP Recent Labs  03/09/21 1524  PROBNP 122   No results for input(s): TSH in the last 8760 hours.  BMP Recent Labs    03/09/21 1524  NA 143  K 4.3  CL 105  CO2 21  GLUCOSE 92  BUN 16  CREATININE 0.92  CALCIUM 9.3    HEMOGLOBIN A1C No results found for: HGBA1C, MPG  External Labs: Collected: 11/30/2019 records provided by Primary care provider. Creatinine 1.32 mg/dL. eGFR: 57 mL/min per 1.73 m Sodium 137, potassium 4.1, chloride 103, bicarb 22, AST 17, ALT 21, alkaline phosphatase 55 Lipid profile: Total cholesterol 157, triglycerides 183, HDL 37, LDL 92 Hemoglobin A1c: 5.9 TSH: 1.14 Hemoglobin 16.8 g/dL, hematocrit 49.3%  IMPRESSION:    ICD-10-CM   1. Dyspnea on exertion  R06.00 Ambulatory referral to Neurology    PCV ECHOCARDIOGRAM COMPLETE    2. Nonobstructive atherosclerosis of  coronary artery  I25.10 Ambulatory referral to Neurology    PCV ECHOCARDIOGRAM COMPLETE    3. Benign hypertension  I10     4. Class 2 severe obesity due to excess calories with serious comorbidity and body mass index (BMI) of 35.0 to 35.9 in adult [facility])  E66.01 Ambulatory referral to Neurology   Z68.35     5. Former smoker  Z87.891     57. History of COVID-19  Z86.16     7. Encounter to discuss test results  Z71.2        RECOMMENDATIONS: Manuel Williams is a 65 y.o. male whose past medical history and cardiac risk factors include: Nonobstructive CAD, sleep apnea not on CPAP, smoker (1ppd for 40 years), hypertension, Hx fof COVID 19 infection, obesity due to excess calorie.   Dyspnea on exertion: Improving. Medications reconciled. BNP within normal limits and overall euvolemic on physical examination. I have asked him to discuss lung cancer screening with his PCP given his long-term history of smoking. Echocardiogram will be ordered to evaluate for structural heart disease and left ventricular systolic function.  Nonobstructive coronary artery disease without angina pectoris: Noted to have nonobstructive CAD in the LAD distribution. Continue aspirin and statin therapy. No active angina factors. Educated on the importance of secondary prevention.  Former smoker: Patient has stopped smoking since last office visit. He is congratulated on his efforts. Has at least 40-year pack history of smoking. Educated on the importance of continued complete smoking cessation.  Patient appears to be very motivated.  Sleep apnea: Patient states that he has a history of sleep apnea but currently not using his CPAP machine. Recommend reevaluation and patient is very motivated to use a CPAP if he still has underlying sleep apnea. Will refer him to [facility] for further evaluation and management.  From a cardiovascular standpoint he is stable and not having progressive symptoms.   Educated on the importance of improving his modifiable cardiovascular risk factors such as but not limited to blood pressure management, cholesterol management, increasing physical activity to 30 minutes a day 5 days a week and monitoring caloric intake to facilitate weight loss, and continued smoking cessation.  FINAL MEDICATION LIST END OF ENCOUNTER: No orders of the defined types were placed in this encounter.    There are no discontinued medications.    Current Outpatient Medications:    aspirin EC 81 MG tablet, Take 81 mg by mouth daily. Swallow whole., Disp: , Rfl:    atorvastatin (LIPITOR) 40 MG tablet, Take 1 tablet (40 mg total) by mouth at bedtime., Disp: 90 tablet, Rfl: 0   finasteride (  PROSCAR) 5 MG tablet, Take 5 mg by mouth daily., Disp: , Rfl:    nitroGLYCERIN (NITROSTAT) 0.4 MG SL tablet, Place 1 tablet (0.4 mg total) under the tongue every 5 (five) minutes as needed for chest pain. If you require more than two tablets five minutes apart go to the nearest ER via EMS., Disp: 30 tablet, Rfl: 0   olmesartan-hydrochlorothiazide (BENICAR HCT) 20-12.5 MG tablet, Take 1 tablet by mouth daily., Disp: , Rfl: 4   tamsulosin (FLOMAX) 0.4 MG CAPS capsule, Take 0.4 mg by mouth 2 (two) times daily., Disp: , Rfl: 12  Orders Placed This Encounter  Procedures   Ambulatory referral to Neurology   PCV ECHOCARDIOGRAM COMPLETE    There are no Patient Instructions on file for this visit.   --Continue cardiac medications as reconciled in final medication list. --Return in about 6 months (around 09/26/2021) for Follow up nonobstructive CAD, risk factor management.. Or sooner if needed. --Continue follow-up with your primary care physician regarding the management of your other chronic comorbid conditions.  Patient's questions and concerns were addressed to his satisfaction. He voices understanding of the instructions provided during this encounter.   This note was created using a voice  recognition software as a result there may be grammatical errors inadvertently enclosed that do not reflect the nature of this encounter. Every attempt is made to correct such errors.  Rex Kras, [place], [facility]  Pager: [phone] Office: [phone]

## 2021-03-30 ENCOUNTER — Ambulatory Visit: Payer: 59

## 2021-03-30 ENCOUNTER — Other Ambulatory Visit: Payer: Self-pay

## 2021-03-30 DIAGNOSIS — R0609 Other forms of dyspnea: Secondary | ICD-10-CM

## 2021-03-30 DIAGNOSIS — I2510 Atherosclerotic heart disease of native coronary artery without angina pectoris: Secondary | ICD-10-CM

## 2021-04-04 ENCOUNTER — Ambulatory Visit: Payer: 59 | Admitting: Cardiology

## 2021-04-07 NOTE — Progress Notes (Signed)
Patient called back, I have discussed results with him regarding his echocardiogram.

## 2021-04-07 NOTE — Progress Notes (Signed)
Called patient, NA, LMAM

## 2021-05-30 ENCOUNTER — Ambulatory Visit: Payer: 59 | Admitting: Neurology

## 2021-05-30 ENCOUNTER — Encounter: Payer: Self-pay | Admitting: Neurology

## 2021-05-30 VITALS — BP 125/76 | HR 54 | Ht 73.0 in | Wt 276.5 lb

## 2021-05-30 DIAGNOSIS — I200 Unstable angina: Secondary | ICD-10-CM

## 2021-05-30 DIAGNOSIS — R0683 Snoring: Secondary | ICD-10-CM | POA: Diagnosis not present

## 2021-05-30 DIAGNOSIS — G4733 Obstructive sleep apnea (adult) (pediatric): Secondary | ICD-10-CM

## 2021-05-30 DIAGNOSIS — J449 Chronic obstructive pulmonary disease, unspecified: Secondary | ICD-10-CM

## 2021-05-30 DIAGNOSIS — G4719 Other hypersomnia: Secondary | ICD-10-CM | POA: Diagnosis not present

## 2021-05-30 DIAGNOSIS — Z6836 Body mass index (BMI) 36.0-36.9, adult: Secondary | ICD-10-CM

## 2021-05-30 DIAGNOSIS — G4734 Idiopathic sleep related nonobstructive alveolar hypoventilation: Secondary | ICD-10-CM

## 2021-05-30 MED ORDER — TRIAMCINOLONE ACETONIDE 55 MCG/ACT NA AERO: 2.0000 | INHALATION_SPRAY | Freq: Every day | NASAL | 12 refills | Status: AC

## 2021-05-30 NOTE — Progress Notes (Addendum)
SLEEP MEDICINE CLINIC    Provider:  Larey Seat, MD  Primary Care Physician:  Everardo Beals, NP [place] [address]     Referring Provider: Terri Skains , DO         Chief Complaint according to patient   Patient presents with:     New Patient (Initial Visit)           HISTORY OF PRESENT ILLNESS:  EFRAIN BOSARGE is a 65 y.o. Caucasian male patient seen here as a referral on 05/30/2021 from CARDIOLOGIST  for a sleep evaluation.  Chief concern according to patient :  I had  chest pain, 3-4 weeks ago, felt weak and funny, my boss sent me to the doctor, EKG , heart cath was negative for blockages. .   I never could use CPA, it didn't feel right, nasal blockage,    I have the pleasure of seeing ALEKS LILJA today, a right -handed Caucasian male with a possible sleep disorder.   He  has a past medical history of BPH (benign prostatic hyperplasia), Hypertension, Nonobstructive atherosclerosis of coronary artery, and Prostate enlargement. Untreated OSA, severe.    The patient had the first sleep study in the year 2017  with a result of obstructive sleep apnea being diagnosed his sleep study was a split-night report dated 10-03-2015.   Had slept between 10 PM and 1 AM and did not enter REM sleep or dream sleep.  He had 47.5 obstructive apneas and hypopneas per hour of sleep.  There is 3 and 4% desaturation.  His RDI was 16.2/h he mostly slept on his right side or left side and a about 10% of the night on his back.  Obstructive apneas were strictly supine sleep accentuated.  Total sleep time was 46.4 AHI.  Oxygen desaturation at nadir was 70%.  So this severe sleep apnea was then converted to a CPAP titration study and here again the patient did not enter deep sleep but he was able to enter REM sleep for about 36% of the therapeutic portion of the night.  His AHI was significantly reduced his oxygen nadir rose to 87%.  No cardiac events occurred.  CO2 was not  measured.  CPAP at 16 cm was reached and was recommended for treatment.  The patient was fitted only with a full facemask he reports.  This was a large size Psychiatrist model.  Heated humidity was added, optimal pressure was set at 16 cm water pressure and as I understand no EPR was ordered.  He did have PLM's during the night and those PLM's actually increased once the sleep apnea had significantly decreased they were only reported at 1415 and 16 cmH2O.  He had PVCs according to the technologist notes.    Sleep relevant medical history: deviated septum, congested, obese, facial smelling, redness.   Family medical /sleep history: no  other family member on CPAP with OSA, insomnia, sleep walkers. mother with dementia at age 40.         Sleep habits are as follows: The patient's dinner time is between 6 PM. The patient goes to bed at 9 PM and continues to sleep for 6 hours, wakes for 1-2 bathroom breaks, the first time at 1 AM.   The preferred sleep position is sideways, with the support of 2 pillows. Dreams are reportedly rare. 3.30   AM is the usual rise time. The patient wakes up with an alarm.  He  reports not feeling refreshed or restored in AM, with symptoms such as dry mouth, no morning headaches, and residual fatigue.  Naps are taken frequently, lasting from 20 to 30 minutes and are more refreshing than nocturnal slee  Family history , father died at 3 and mother still living, at age 21.   Social history - Caffeine drinker, 3 coffees a day. Beer ' once in a while". Quit 3 month ago He works as a Dealer and drives a truck.   Has stepchildren, is married, no pets.  No shift work  ROS :   How likely are you to doze in the following situations: 0 = not likely, 1 = slight chance, 2 = moderate chance, 3 = high chance  Sitting and Reading? Watching Television? Sitting inactive in a public place (theater or meeting)? Lying down in the afternoon when circumstances  permit? Sitting and talking to someone? Sitting quietly after lunch without alcohol? In a car, while stopped for a few minutes in traffic? As a passenger in a car for an hour without a break?  Total = 17/ 24 points, GDS 3 / 15 points, fatigue not answered.   Social History   Socioeconomic History   Marital status: Married    Spouse name: Not on file   Number of children: Not on file   Years of education: Not on file   Highest education level: Not on file  Occupational History   Not on file  Tobacco Use   Smoking status: Former    Packs/day: 1.00    Types: Cigarettes   Smokeless tobacco: Former    Types: Chew    Quit date: 10/01/1968  Vaping Use   Vaping Use: Never used  Substance and Sexual Activity   Alcohol use: Yes    Alcohol/week: 1.0 standard drink    Types: 1 Cans of beer per week    Comment: occ   Drug use: No   Sexual activity: Not on file  Other Topics Concern   Not on file  Social History Narrative   Not on file   Social Determinants of Health   Financial Resource Strain: Not on file  Food Insecurity: Not on file  Transportation Needs: Not on file  Physical Activity: Not on file  Stress: Not on file  Social Connections: Not on file  Intimate Partner Violence: Not on file    Family History  Problem Relation Age of Onset   Dementia Mother    Stroke Father    Colon cancer Father     Past Medical History:  Diagnosis Date   BPH (benign prostatic hyperplasia)    Hypertension    Nonobstructive atherosclerosis of coronary artery    Prostate enlargement     Past Surgical History:  Procedure Laterality Date   CYST REMOVAL NECK  1970   LEFT HEART CATH AND CORONARY ANGIOGRAPHY N/A 03/10/2021   Procedure: LEFT HEART CATH AND CORONARY ANGIOGRAPHY;  Surgeon: Nigel Mormon, MD;  Location: [facility];  Service: Cardiovascular;  Laterality: N/A;    Current Outpatient Medications  Medication Sig Dispense Refill   aspirin EC 81 MG tablet  Take 81 mg by mouth daily. Swallow whole.     atorvastatin (LIPITOR) 40 MG tablet Take 1 tablet (40 mg total) by mouth at bedtime. 90 tablet 0   finasteride (PROSCAR) 5 MG tablet Take 5 mg by mouth daily.     olmesartan-hydrochlorothiazide (BENICAR HCT) 20-12.5 MG tablet Take 1 tablet by mouth daily.  4  tamsulosin (FLOMAX) 0.4 MG CAPS capsule Take 0.4 mg by mouth 2 (two) times daily.  12   nitroGLYCERIN (NITROSTAT) 0.4 MG SL tablet Place 1 tablet (0.4 mg total) under the tongue every 5 (five) minutes as needed for chest pain. If you require more than two tablets five minutes apart go to the nearest ER via EMS. 30 tablet 0   No current facility-administered medications for this visit.    Allergies as of 05/30/2021   (No Known Allergies)    Vitals: BP 125/76 (BP Location: Left Arm, Patient Position: Sitting, Cuff Size: Normal)   Pulse (!) 54   Ht '6\' 1"'$  (1.854 m)   Wt 276 lb 8 oz (125.4 kg)   SpO2 98%   BMI 36.48 kg/m  Last Weight:  Wt Readings from Last 1 Encounters:  05/30/21 276 lb 8 oz (125.4 kg)   Last Height:   Ht Readings from Last 1 Encounters:  05/30/21 '6\' 1"'$  (1.854 m)   Physical exam:  General: The patient is awake, alert and appears not in acute distress. The patient is well groomed. Head: Normocephalic, atraumatic. Neck is supple. Mallampati 3, neck circumference:19 Cardiovascular:  Regular rate and rhythm, without  murmurs or carotid bruit, and without distended neck veins. Respiratory: Lungs are clear to auscultation. Skin:  Without evidence of edema, but facial redness- puffiness Trunk: BMI is 36.5 / elevated and patient  has normal posture.   Neurologic exam : The patient is awake and alert, oriented to place and time.  Memory subjective  described as intact. There is a normal attention span & concentration ability. Speech is fluent with dysarthria, dysphonia ,not  aphasia. Mood and affect are appropriate.  Cranial nerves: Pupils are equally small, 2-3 mm  and barely  reactive to light. Funduscopic exam without edema. Left iris at 2 o'clock has a small lacune.   Extraocular movements  in vertical and horizontal planes intact and without nystagmus. Visual fields by finger perimetry are intact. Hearing to finger rub intact.  Facial sensation intact to fine touch. Facial motor strength is symmetric and tongue and uvula move midline.  Motor exam:   Normal tone and normal muscle bulk and symmetric normal strength in all extremities.  Sensory:  Fine touch, pinprick and vibration were tested in all extremities. Proprioception is tested in the upper extremities only. This was  normal.  Coordination: Rapid alternating movements in the fingers/hands is tested and normal. Finger-to-nose maneuver tested and normal without evidence of ataxia, dysmetria or tremor.  Gait and station: Patient walks without assistive device.  Deep tendon reflexes: in the  upper and lower extremities are symmetric and intact. Babinski maneuver response is  downgoing.       Social History   Socioeconomic History   Marital status: Married    Spouse name: Not on file   Number of children: Not on file   Years of education: Not on file   Highest education level: Not on file  Occupational History   Not on file  Tobacco Use   Smoking status: Former    Packs/day: 1.00    Types: Cigarettes   Smokeless tobacco: Former    Types: Chew    Quit date: 10/01/1968  Vaping Use   Vaping Use: Never used  Substance and Sexual Activity   Alcohol use: Yes    Alcohol/week: 1.0 standard drink    Types: 1 Cans of beer per week    Comment: occ   Drug use: No   Sexual activity:  Not on file  Other Topics Concern   Not on file  Social History Narrative   Not on file   Social Determinants of Health   Financial Resource Strain: Not on file  Food Insecurity: Not on file  Transportation Needs: Not on file  Physical Activity: Not on file  Stress: Not on file  Social Connections: Not  on file    Family History  Problem Relation Age of Onset   Dementia Mother    Stroke Father    Colon cancer Father     Past Medical History:  Diagnosis Date   BPH (benign prostatic hyperplasia)    Hypertension    Nonobstructive atherosclerosis of coronary artery    Prostate enlargement     Past Surgical History:  Procedure Laterality Date   CYST REMOVAL NECK  1970   LEFT HEART CATH AND CORONARY ANGIOGRAPHY N/A 03/10/2021   Procedure: LEFT HEART CATH AND CORONARY ANGIOGRAPHY;  Surgeon: Nigel Mormon, MD;  Location: [facility];  Service: Cardiovascular;  Laterality: N/A;     Current Outpatient Medications on File Prior to Visit  Medication Sig Dispense Refill   aspirin EC 81 MG tablet Take 81 mg by mouth daily. Swallow whole.     atorvastatin (LIPITOR) 40 MG tablet Take 1 tablet (40 mg total) by mouth at bedtime. 90 tablet 0   finasteride (PROSCAR) 5 MG tablet Take 5 mg by mouth daily.     olmesartan-hydrochlorothiazide (BENICAR HCT) 20-12.5 MG tablet Take 1 tablet by mouth daily.  4   tamsulosin (FLOMAX) 0.4 MG CAPS capsule Take 0.4 mg by mouth 2 (two) times daily.  12   nitroGLYCERIN (NITROSTAT) 0.4 MG SL tablet Place 1 tablet (0.4 mg total) under the tongue every 5 (five) minutes as needed for chest pain. If you require more than two tablets five minutes apart go to the nearest ER via EMS. 30 tablet 0   No current facility-administered medications on file prior to visit.    No Known Allergies  Physical exam:  Today's Vitals   05/30/21 0924  BP: 125/76  Pulse: (!) 54  SpO2: 98%  Weight: 276 lb 8 oz (125.4 kg)  Height: '6\' 1"'$  (1.854 m)   Body mass index is 36.48 kg/m.   Wt Readings from Last 3 Encounters:  05/30/21 276 lb 8 oz (125.4 kg)  03/27/21 268 lb (121.6 kg)  03/10/21 260 lb (117.9 kg)     Ht Readings from Last 3 Encounters:  05/30/21 '6\' 1"'$  (1.854 m)  03/27/21 '6\' 1"'$  (1.854 m)  03/10/21 '6\' 1"'$  (1.854 m)          After spending a total  time of  45  minutes face to face and additional time for physical and neurologic examination, review of laboratory studies,  personal review of imaging studies, reports and results of other testing and review of referral information / records as far as provided in visit, I have established the following assessments:  1) Mr. Mcwain has been found to recently a smoker, he does struggle with some overweight, he does have nasal congestion which may be chronic and he has been identified as having severe obstructive sleep apnea and a split-night polysomnography performed 5-1/2 years ago.  However ,the CPAP he could never tolerate.   Given that to just 3 weeks ago he had a symptoms of heart attack and was worked up by cardiology he has decided to quit smoking, he does have benign hypertension, class II obesity, dyspnea  on exertion, nonobstructive atherosclerosis of the coronary arteries, his recent labs did not reveal any diabetes, he had normal hemoglobin hematocrit actually slightly elevated.  Normal glomerular filtration rate.  His history also mentions that he had contracted COVID-19 in 2020.  He is blissfully unaware of many of his medical conditions and risk factors.     My Plan is to proceed with:  1) I like for this patient to undergo a SPLIT night , but this is almost impossible with [facility] coverage.  Will order SLIT and HST .  I would like to thank Everardo Beals, NP and Everardo Beals, [place],  [place] [address] for allowing me to meet with and to take care of this pleasant patient.   In short, TATSUYA MARANA is presenting with untreated and , severe OSA, hypoxia and non obstructive CAD, has likely COPD and a very narrow airway, large neck. And EDS- a symptom that can be attributed to OSA.  RV with our NP within 2-4  month.   CC: I will share my notes with PCP .  Electronically signed by: Larey Seat, MD 05/30/2021 9:52 AM  [facility] and  [facility] Sleep Board certified by The [employer] of Sleep Medicine and Diplomate of the [employer] of Sleep Medicine. Board certified In Neurology through the [place], Fellow of the [employer] of Neurology. Medical Director of [employer].

## 2021-05-30 NOTE — Patient Instructions (Signed)
Sleep apnea  Sleep Apnea Sleep apnea is a condition in which breathing pauses or becomes shallow during sleep. People with sleep apnea usually snore loudly. They may have times when they gasp and stop breathing for 10 seconds or more during sleep. This mayhappen many times during the night. Sleep apnea disrupts your sleep and keeps your body from getting the rest that it needs. This condition can increase your risk of certain health problems, including: Heart attack. Stroke. Obesity. Type 2 diabetes. Heart failure. Irregular heartbeat. High blood pressure. The goal of treatment is to help you breathe normally again. What are the causes? The most common cause of sleep apnea is a collapsed or blocked airway. There are three kinds of sleep apnea: Obstructive sleep apnea. This kind is caused by a blocked or collapsed airway. Central sleep apnea. This kind happens when the part of the brain that controls breathing does not send the correct signals to the muscles that control breathing. Mixed sleep apnea. This is a combination of obstructive and central sleep apnea. What increases the risk? You are more likely to develop this condition if you: Are overweight. Smoke. Have a smaller than normal airway. Are older. Are male. Drink alcohol. Take sedatives or tranquilizers. Have a family history of sleep apnea. Have a tongue or tonsils that are larger than normal. What are the signs or symptoms? Symptoms of this condition include: Trouble staying asleep. Loud snoring. Morning headaches. Waking up gasping. Dry mouth or sore throat in the morning. Daytime sleepiness and tiredness. If you have daytime fatigue because of sleep apnea, you may be more likely to have: Trouble concentrating. Forgetfulness. Irritability or mood swings. Personality changes. Feelings of depression. Sexual dysfunction. This may include loss of interest if you are male, or erectile dysfunction if you are  male. How is this diagnosed? This condition may be diagnosed with: A medical history. A physical exam. A series of tests that are done while you are sleeping (sleep study). These tests are usually done in a sleep lab, but they may also be done at home. How is this treated? Treatment for this condition aims to restore normal breathing and to ease symptoms during sleep. It may involve managing health issues that can affect breathing, such as high blood pressure or obesity. Treatment may include: Sleeping on your side. Using a decongestant if you have nasal congestion. Avoiding the use of depressants, including alcohol, sedatives, and narcotics. Losing weight if you are overweight. Making changes to your diet. Quitting smoking. Using a device to open your airway while you sleep, such as: An oral appliance. This is a custom-made mouthpiece that shifts your lower jaw forward. A continuous positive airway pressure (CPAP) device. This device blows air through a mask when you breathe out (exhale). A nasal expiratory positive airway pressure (EPAP) device. This device has valves that you put into each nostril. A bi-level positive airway pressure (BPAP) device. This device blows air through a mask when you breathe in (inhale) and breathe out (exhale). Having surgery if other treatments do not work. During surgery, excess tissue is removed to create a wider airway. Follow these instructions at home: Lifestyle Make any lifestyle changes that your health care provider recommends. Eat a healthy, well-balanced diet. Take steps to lose weight if you are overweight. Avoid using depressants, including alcohol, sedatives, and narcotics. Do not use any products that contain nicotine or tobacco. These products include cigarettes, chewing tobacco, and vaping devices, such as e-cigarettes. If you need help quitting,  ask your health care provider. General instructions Take over-the-counter and prescription  medicines only as told by your health care provider. If you were given a device to open your airway while you sleep, use it only as told by your health care provider. If you are having surgery, make sure to tell your health care provider you have sleep apnea. You may need to bring your device with you. Keep all follow-up visits. This is important. Contact a health care provider if: The device that you received to open your airway during sleep is uncomfortable or does not seem to be working. Your symptoms do not improve. Your symptoms get worse. Get help right away if: You develop: Chest pain. Shortness of breath. Discomfort in your back, arms, or stomach. You have: Trouble speaking. Weakness on one side of your body. Drooping in your face. These symptoms may represent a serious problem that is an emergency. Do not wait to see if the symptoms will go away. Get medical help right away. Call your local emergency services (911 in the [place] Do not drive yourself to the hospital. Summary Sleep apnea is a condition in which breathing pauses or becomes shallow during sleep. The most common cause is a collapsed or blocked airway. The goal of treatment is to restore normal breathing and to ease symptoms during sleep. This information is not intended to replace advice given to you by your health care provider. Make sure you discuss any questions you have with your healthcare provider. Document Revised: 08/26/2020 Document Reviewed: 08/26/2020 Elsevier Patient Education  2022 [employer].

## 2021-06-02 ENCOUNTER — Other Ambulatory Visit: Payer: Self-pay | Admitting: Cardiology

## 2021-06-02 DIAGNOSIS — R0609 Other forms of dyspnea: Secondary | ICD-10-CM

## 2021-06-02 DIAGNOSIS — R0600 Dyspnea, unspecified: Secondary | ICD-10-CM

## 2021-06-02 DIAGNOSIS — I200 Unstable angina: Secondary | ICD-10-CM

## 2021-06-28 ENCOUNTER — Telehealth: Payer: Self-pay

## 2021-06-28 NOTE — Telephone Encounter (Signed)
Called patient to schedule sleep study - no answer and VM was full.

## 2021-08-16 ENCOUNTER — Other Ambulatory Visit: Payer: Self-pay

## 2021-08-16 ENCOUNTER — Ambulatory Visit (INDEPENDENT_AMBULATORY_CARE_PROVIDER_SITE_OTHER): Payer: 59 | Admitting: Neurology

## 2021-08-16 DIAGNOSIS — G4734 Idiopathic sleep related nonobstructive alveolar hypoventilation: Secondary | ICD-10-CM

## 2021-08-16 DIAGNOSIS — G4719 Other hypersomnia: Secondary | ICD-10-CM

## 2021-08-16 DIAGNOSIS — E66812 Obesity, class 2: Secondary | ICD-10-CM

## 2021-08-16 DIAGNOSIS — G4733 Obstructive sleep apnea (adult) (pediatric): Secondary | ICD-10-CM

## 2021-08-16 DIAGNOSIS — Z6836 Body mass index (BMI) 36.0-36.9, adult: Secondary | ICD-10-CM

## 2021-08-16 DIAGNOSIS — R0683 Snoring: Secondary | ICD-10-CM

## 2021-08-16 DIAGNOSIS — J449 Chronic obstructive pulmonary disease, unspecified: Secondary | ICD-10-CM

## 2021-08-16 DIAGNOSIS — I200 Unstable angina: Secondary | ICD-10-CM

## 2021-09-05 ENCOUNTER — Telehealth: Payer: Self-pay | Admitting: Neurology

## 2021-09-05 NOTE — Progress Notes (Signed)
The arousals were noted as: 50 were spontaneous, 14 were associated with PLMs, 99 were associated with respiratory events. OXYGEN SATURATION & C02:  The [place] baseline 02 saturation was 93%, with the lowest being 76%. Time spent below 89% saturation equaled 105 minutes.   IMPRESSION:  1. Severe Obstructive Sleep Apnea (OSA), with COPD overlay.  2. 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 Significant Sleep hypoxemia- nadir at 76% duration 105 minutes !     RECOMMENDATIONS: Bradycardia and hypoxemia were most evident in REM sleep- not an inspire candidate.   1. Advise full-night, attended, CPAP titration study to optimize therapy. patient has been fitted with a FFM VITERA  in medium size.  We need to document if additional oxygen is needed.

## 2021-09-05 NOTE — Procedures (Signed)
PATIENT'S NAME:  Manuel Williams, Manuel Williams DOB:      1956-09-15      MR#:    [id number]     DATE OF RECORDING: 08/16/2021 REFERRING M.D.:  Rex Kras, DO Study ordered SPLIT/ Oxygen  Study Performed:   Baseline Polysomnogram HISTORY:   Manuel Williams is a 65 y.o. Caucasian male patient seen here as a referral on 05/30/2021 from his CARDIOLOGIST  for a sleep evaluation. He has a past medical history of BPH (benign prostatic hyperplasia), Hypertension, Nonobstructive atherosclerosis of coronary artery, and Prostate enlargement. Untreated OSA, severe, possibly COPD. Unable to tolerate CPAP in the past. Here for SPLIT  Chief concern according to patient :  "I had chest pain, 3-4 weeks ago, felt weak and funny, my boss sent me to the doctor, EKG , heart cath was negative for blockages. I never could use CPAP, it didn't feel right, nasal congestion made it uncomfortable".    The patient endorsed the Epworth Sleepiness Scale at 17 /24 points.   The patient's weight 276 pounds with a height of 73 (inches), resulting in a BMI of 36.5 kg/m2. The patient's neck circumference measured 19 inches.  CURRENT MEDICATIONS: Aspirin, Lipitor, Proscar, Benicar HCT, Flomax, Nitrostat   PROCEDURE:  This is a multichannel digital polysomnogram utilizing the Somnostar 11.2 system.  Electrodes and sensors were applied and monitored per AASM Specifications.   EEG, EOG, Chin and Limb EMG, were sampled at 200 Hz.  ECG, Snore and Nasal Pressure, Thermal Airflow, Respiratory Effort, CPAP Flow and Pressure, Oximetry was sampled at 50 Hz. Digital video and audio were recorded.      BASELINE STUDY: Lights Out was at 20:37 and Lights On at 05:01.  Total recording time (TRT) was 504.5 minutes, with a total sleep time (TST) of 474.5 minutes.   The patient's sleep latency was 8 minutes.  REM latency was 108.5 minutes.  The sleep efficiency was 94.1 %.     SLEEP ARCHITECTURE: WASO (Wake after sleep onset) was 23.5 minutes.  There were 7 minutes  in Stage N1, 347.5 minutes Stage N2, 34 minutes Stage N3 and 86 minutes in Stage REM.  The percentage of Stage N1 was 1.5%, Stage N2 was 73.2%, Stage N3 was 7.2% and Stage R (REM sleep) was 18.1%.   RESPIRATORY ANALYSIS:  There were a total of 250 respiratory events:  122 obstructive apneas, 2 central apneas and 5 mixed apneas with a total of 129 apneas and an apnea index (AI) of 16.3 /hour. There were 121 hypopneas with a hypopnea index of 15.3 /hour.  The total APNEA/HYPOPNEA INDEX (AHI) was 31.6/hour.  55 events occurred in REM sleep and 176 events in NREM. The REM AHI was  38.4 /hour, versus a non-REM AHI of 30.1.  The patient spent 29 minutes of total sleep time in the supine position and 446 minutes in non-supine. The supine AHI was 20.7 versus a non-supine AHI of 32.3.  OXYGEN SATURATION & C02:  The Wake baseline 02 saturation was 93%, with the lowest being 76%. Time spent below 89% saturation equaled 105 minutes. This is severe.     The arousals were noted as: 50 were spontaneous, 14 were associated with PLMs, 99 were associated with respiratory events. The patient had a total of 194 Periodic Limb Movements.  The Periodic Limb Movement (PLM) Arousal index was 1.8/hour.  Snoring was noted. EKG was in keeping with regular sinus rhythm (NSR). Post-study, the patient indicated that sleep was the same as usual.  IMPRESSION:  Severe Obstructive Sleep Apnea (OSA), with COPD overlay.  Moderately severe Periodic Limb Movement Disorder (PLMD) Significant Sleep hypoxemia- nadir at 76% duration 105 minutes !     RECOMMENDATIONS: Bradycardia and hypoxemia were most evident in REM sleep- not an inspire candidate.   Advise full-night, attended, CPAP titration study to optimize therapy. patient has been fitted with a FFM VITERA  in medium size.      I certify that I have reviewed the entire raw data recording prior to the issuance of this report in accordance with the Standards of  Accreditation of the American Academy of Sleep Medicine (AASM)   Larey Seat, MD Diplomat, American Board of Psychiatry and Neurology  Diplomat, American Board of Sleep Medicine Market researcher, [place] [facility] [employer]

## 2021-09-05 NOTE — Telephone Encounter (Signed)
-----   Message from Larey Seat, MD sent at 09/05/2021  8:46 AM EST ----- The arousals were noted as: 50 were spontaneous, 14 were associated with PLMs, 99 were associated with respiratory events. OXYGEN SATURATION & C02:  The Wake baseline 02 saturation was 93%, with the lowest being 76%. Time spent below 89% saturation equaled 105 minutes.   IMPRESSION:  1. Severe Obstructive Sleep Apnea (OSA), with COPD overlay.  2. 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 Significant Sleep hypoxemia- nadir at 76% duration 105 minutes !     RECOMMENDATIONS: Bradycardia and hypoxemia were most evident in REM sleep- not an inspire candidate.   1. Advise full-night, attended, CPAP titration study to optimize therapy. patient has been fitted with a FFM VITERA  in medium size.  We need to document if additional oxygen is needed.

## 2021-09-05 NOTE — Telephone Encounter (Signed)
I called pt. I advised pt that Dr. Brett Fairy reviewed their sleep study results and found that has severe sleep apnea and recommends that pt be treated with a cpap. Dr. Brett Fairy recommends that pt return for a repeat sleep study in order to properly titrate the cpap and ensure a good mask fit. Pt is agreeable to returning for a titration study. I advised pt that our sleep lab will file with pt's insurance and call pt to schedule the sleep study when we hear back from the pt's insurance regarding coverage of this sleep study. Pt verbalized understanding of results. Pt had no questions at this time but was encouraged to call back if questions arise.

## 2021-09-05 NOTE — Addendum Note (Signed)
Addended by: Larey Seat on: 09/05/2021 08:46 AM   Modules accepted: Orders

## 2021-09-07 ENCOUNTER — Telehealth: Payer: Self-pay | Admitting: Neurology

## 2021-09-07 NOTE — Telephone Encounter (Signed)
[facility] ([facility]) called, clarification on if we need to put in the order for CPAP titration or your office. Would like a call back from the nurse.

## 2021-09-07 NOTE — Telephone Encounter (Signed)
Called the primary care office back to let them know that we placed the order for the patient to have a cpap titration study.   There was no answer, LVM advising nothing needed to from them

## 2021-09-27 ENCOUNTER — Ambulatory Visit: Payer: 59 | Admitting: Cardiology

## 2021-10-04 ENCOUNTER — Other Ambulatory Visit: Payer: Self-pay

## 2021-10-04 ENCOUNTER — Ambulatory Visit (INDEPENDENT_AMBULATORY_CARE_PROVIDER_SITE_OTHER): Payer: 59 | Admitting: Neurology

## 2021-10-04 DIAGNOSIS — G4733 Obstructive sleep apnea (adult) (pediatric): Secondary | ICD-10-CM | POA: Diagnosis not present

## 2021-10-04 DIAGNOSIS — G4734 Idiopathic sleep related nonobstructive alveolar hypoventilation: Secondary | ICD-10-CM

## 2021-10-04 DIAGNOSIS — J449 Chronic obstructive pulmonary disease, unspecified: Secondary | ICD-10-CM

## 2021-10-04 DIAGNOSIS — Z789 Other specified health status: Secondary | ICD-10-CM

## 2021-10-17 DIAGNOSIS — Z789 Other specified health status: Secondary | ICD-10-CM | POA: Insufficient documentation

## 2021-10-17 NOTE — Procedures (Signed)
PATIENT'S NAME:  Manuel Williams, Manuel Williams DOB:      1956/08/29      MR#:    [id number]     DATE OF RECORDING: 10/04/2021  MR REFERRING M.D.:  Rex Kras, [place] Study Performed:   Titration to positive airway pressure. HISTORY: 05-30-2021: Manuel Williams is a 66 y.o. Caucasian male patient who was seen for a Sleep Medicine Consultation. He has a past medical history of BPH (benign prostatic hyperplasia), Hypertension, Non- obstructive atherosclerosis of coronary artery, and Prostate enlargement. Untreated OSA, severe, possibly COPD. Unable to tolerate CPAP in the past when titrated to 16 cm water on 10-03-2015.  Excessive daytime sleepiness reported.  The patient returned following a Diagnostic polysomnogram performed from 08/16/2021 which revealed: Severe Obstructive Sleep Apnea (OSA), with COPD overlay. Moderately- severe Periodic Limb Movement Disorder (PLMD) Significant Sleep hypoxemia- nadir at 76%, for a duration of 105 minutes  The patient endorsed the Epworth Sleepiness Scale at 17/24 points.   The patient's weight 276 pounds with a height of 73 (inches), resulting in a BMI of 36.5 kg/m2. The patient's neck circumference measured 19 inches.  CURRENT MEDICATIONS: Aspirin, Lipitor, Proscar, Benicar HCT, Flomax, Nitrostat   PROCEDURE:  This is a multichannel digital polysomnogram utilizing the SomnoStar 11.2 system.  Electrodes and sensors were applied and monitored per AASM Specifications.   EEG, EOG, Chin and Limb EMG, were sampled at 200 Hz.  ECG, Snore and Nasal Pressure, Thermal Airflow, Respiratory Effort, CPAP Flow and Pressure, Oximetry was sampled at 50 Hz. Digital video and audio were recorded.      CPAP was initiated at 5 cmH20 with heated humidity per AASM standards, a VITERA FFM was used. The pressure was gradually advanced to 15cmH20 because of hypopneas, apneas, snoring and desaturations.  Under CPAP pressure of 15 cmH20, there was a reduction of the AHI to 0.6/h with improvement of sleep  apnea. Total time asleep under this pressure was over 4 hours  The patient reached REM sleep and slept supine. The patient experienced a cluster of PLMs between 3.40 AM and 4.35 AM that remain unexplained. There were no PLM related relevant arousals.  Lights Out was at 21:35 and Lights On at 04:53. Total recording time (TRT) was 438.5 minutes, with a total sleep time (TST) of 422 minutes. The patient's sleep latency was 12.5 minutes. REM latency was 74.5 minutes.  The sleep efficiency was 96.2 %.    SLEEP ARCHITECTURE: WASO (Wake after sleep onset) was 13.5 minutes.   There were 7.5 minutes in Stage N1, 213.5 minutes Stage N2, 95 minutes Stage N3 and 106 minutes in Stage REM.  The percentage of Stage N1 was 1.8%, Stage N2 was 50.6%, Stage N3 was 22.5% and Stage R (REM sleep) was 25.1%.  RESPIRATORY ANALYSIS:  There was a total of 69 respiratory events: 45 obstructive apneas, 0 central apneas and 3 mixed apneas 21 hypopneas.      The total APNEA/HYPOPNEA INDEX (AHI) was 9.8 /hour. 2 events occurred in REM sleep and 67 events in NREM. The REM AHI was 1.1 /hour versus a non-REM AHI of 12.7 /hour.  The patient spent 327 minutes of total sleep time in the supine position and 95 minutes in non-supine.  The supine AHI was 12.7, versus a non-supine AHI of 0.0.  OXYGEN SATURATION & C02:  The baseline 02 saturation was 91%, with the lowest being 84%. Time spent below 89% saturation equaled 20 minutes.  PERIODIC LIMB MOVEMENTS:  The patient had a total of  285 Periodic Limb Movements. The Periodic Limb Movement (PLM) Arousal index was 0.1 /hour. The arousals were noted as: 26 were spontaneous, 1 associated with PLMs, 61 were associated with respiratory events. Audio and video analysis did not show any abnormal or unusual movements, behaviors, phonations or vocalizations.   Snoring was noted. EKG was in keeping with normal sinus rhythm (NSR). The patient was fitted with a Medium Vitera full face  mask.  DIAGNOSIS Obstructive Sleep Apnea with unusual NREM dominant distribution, as it spared REM sleep. This was completely alleviated under CPAP 15 cm.  the residual AHI was 0.6/h.  Mild Sleep Related Hypoxemia was not fully corrected under final pressure of CPAP.  Periodic Limb Movement were not arousal causing.   PLANS/RECOMMENDATIONS: The patient was fitted with a Medium Vitera full face mask. Auto CPAP will be set with a 10- minute RAMP beginning at 7 cm water pressure and max 18 cm water pressure and 3 cm EPR, heated humidification and mask as named here. RV with NP or me in 2-4 months.  Please provide a ResMed machine if possible. ONO on CPAP after 30 days of use.   CPAP therapy compliance is defined as 4 hours or more of nightly use. Any apnea patient should avoid sedatives, hypnotics, and alcohol consumption at bedtime.    A follow up appointment will be scheduled in the Sleep Clinic at [facility].   Please call [phone] with any questions.      I certify that I have reviewed the entire raw data recording prior to the issuance of this report in accordance with the Standards of Accreditation of the American Academy of Sleep Medicine (AASM)  Larey Seat, M.D. Diplomat, Tax adviser of Neurology  Diplomat, Tax adviser of Sleep Medicine Market researcher, [facility] [employer]

## 2021-10-17 NOTE — Addendum Note (Signed)
Addended by: Larey Seat on: 10/17/2021 01:17 PM   Modules accepted: Orders

## 2021-10-17 NOTE — Progress Notes (Signed)
Thank-you for taking care of him.   ST

## 2021-10-17 NOTE — Progress Notes (Signed)
DIAGNOSIS 1. Obstructive Sleep Apnea with unusual NREM dominant distribution, as it spared REM sleep. This was completely alleviated under CPAP 15 cm.  the residual AHI was 0.6/h.  2. Mild Sleep Related Hypoxemia was not fully corrected under final pressure of CPAP.  3. Periodic Limb Movement were not arousal causing.   PLANS/RECOMMENDATIONS: The patient was fitted with a Medium Vitera full face mask. Auto CPAP will be set with a 10- minute RAMP beginning at 7 cm water pressure and max 18 cm water pressure and 3 cm EPR, heated humidification and mask as named here. RV with NP or me in 2-4 months.  Please provide a ResMed machine if possible. ONO on CPAP after 30 days of use.   1. CPAP therapy compliance is defined as 4 hours or more of nightly use. Any apnea patient should avoid sedatives, hypnotics, and alcohol consumption at bedtime.

## 2021-10-18 ENCOUNTER — Telehealth: Payer: Self-pay | Admitting: Neurology

## 2021-10-18 NOTE — Telephone Encounter (Signed)
-----   Message from Larey Seat, MD sent at 10/17/2021  1:17 PM EST ----- DIAGNOSIS 1. Obstructive Sleep Apnea with unusual NREM dominant distribution, as it spared REM sleep. This was completely alleviated under CPAP 15 cm.  the residual AHI was 0.6/h.  2. Mild Sleep Related Hypoxemia was not fully corrected under final pressure of CPAP.  3. Periodic Limb Movement were not arousal causing.   PLANS/RECOMMENDATIONS: The patient was fitted with a Medium Vitera full face mask. Auto CPAP will be set with a 10- minute RAMP beginning at 7 cm water pressure and max 18 cm water pressure and 3 cm EPR, heated humidification and mask as named here. RV with NP or me in 2-4 months.  Please provide a ResMed machine if possible. ONO on CPAP after 30 days of use.   1. CPAP therapy compliance is defined as 4 hours or more of nightly use. Any apnea patient should avoid sedatives, hypnotics, and alcohol consumption at bedtime.

## 2021-10-18 NOTE — Telephone Encounter (Signed)
Called patient to discuss sleep study results. No answer at this time. LVM for the patient to call back.   

## 2021-10-24 ENCOUNTER — Ambulatory Visit: Payer: 59 | Admitting: Cardiology

## 2021-10-24 NOTE — Telephone Encounter (Signed)
Pt returned the call.I advised pt that Dr. Brett Fairy reviewed their sleep study results and found that pt was best treated with CPAP. Dr. Brett Fairy recommends that pt starts auto CPAP. I reviewed PAP compliance expectations with the pt. Pt is agreeable to starting a CPAP. I advised pt that an order will be sent to a DME, [facility], and [facility] will call the pt within about one week after they file with the pt's insurance. [facility] will show the pt how to use the machine, fit for masks, and troubleshoot the CPAP if needed. A follow up appt was made for insurance purposes with Dr. Brett Fairy on April 27,2023. Pt verbalized understanding to arrive 15 minutes early and bring their CPAP. A letter with all of this information in it will be mailed to the pt as a reminder. I verified with the pt that the address we have on file is correct. Pt verbalized understanding of results. Pt had no questions at this time but was encouraged to call back if questions arise. I have sent the order to [facility] and have received confirmation that they have received the order.

## 2021-10-31 ENCOUNTER — Ambulatory Visit: Payer: 59 | Admitting: Cardiology

## 2021-10-31 NOTE — Progress Notes (Deleted)
Date:  10/31/2021   ID:  Manuel Williams, DOB May 14, 1956, MRN [id number]  PCP:  Everardo Beals, NP  Cardiologist:  Rex Kras, DO, [facility] (established care 03/09/2021)  Date: 10/31/21 Last Office Visit: 03/27/2021  No chief complaint on file.   HPI  Manuel Williams is a 66 y.o. male who presents to the office with a chief complaint of " ***." Patient's past medical history and cardiovascular risk factors include: Nonobstructive CAD, sleep apnea not on CPAP, smoker (1ppd for 40 years), hypertension, Hx fof COVID 19 infection, obesity due to excess calorie.   He is referred to the office at the request of Everardo Beals, NP for evaluation of shortness of breath.  Patient presents for follow-up after recently being evaluated for chest pain and shortness of breath at the last office visit.  His symptoms are very concerning for unstable angina and due to continued symptoms had gone to the hospital for further evaluation and management.  He underwent left heart catheterization and was found to have nonobstructive CAD involving the LAD.  Since last office visit patient states that he stopped smoking and since then has felt significantly better.  He is less short of breath and no longer has chest discomfort.  Patient continues to have tired and fatigue but much better since last visit.  No family history of premature coronary disease or sudden cardiac death.  FUNCTIONAL STATUS: No structured exercise program or daily routine.  ALLERGIES: No Known Allergies  MEDICATION LIST PRIOR TO VISIT: No outpatient medications have been marked as taking for the 10/31/21 encounter (Appointment) with Terri Skains, Esiah Bazinet, DO.     PAST MEDICAL HISTORY: Past Medical History:  Diagnosis Date   BPH (benign prostatic hyperplasia)    Hypertension    Nonobstructive atherosclerosis of coronary artery    Prostate enlargement     PAST SURGICAL HISTORY: Past Surgical History:  Procedure Laterality Date    CYST REMOVAL NECK  1970   LEFT HEART CATH AND CORONARY ANGIOGRAPHY N/A 03/10/2021   Procedure: LEFT HEART CATH AND CORONARY ANGIOGRAPHY;  Surgeon: Nigel Mormon, MD;  Location: [facility];  Service: Cardiovascular;  Laterality: N/A;    FAMILY HISTORY: The patient family history includes Colon cancer in his father; Dementia in his mother; Stroke in his father.  SOCIAL HISTORY:  The patient  reports that he has quit smoking. His smoking use included cigarettes. He smoked an average of 1 pack per day. He quit smokeless tobacco use about 53 years ago.  His smokeless tobacco use included chew. He reports current alcohol use of about 1.0 standard drink per week. He reports that he does not use drugs.  REVIEW OF SYSTEMS: Review of Systems  Constitutional: Negative for chills, fever and malaise/fatigue.  HENT:  Negative for hoarse voice and nosebleeds.   Eyes:  Negative for discharge, double vision and pain.  Cardiovascular:  Positive for dyspnea on exertion (improved.). Negative for chest pain, claudication, leg swelling, near-syncope, orthopnea, palpitations, paroxysmal nocturnal dyspnea and syncope.  Respiratory:  Negative for hemoptysis and shortness of breath.   Musculoskeletal:  Negative for muscle cramps and myalgias.  Gastrointestinal:  Negative for abdominal pain, constipation, diarrhea, hematemesis, hematochezia, melena, nausea and vomiting.  Neurological:  Negative for dizziness and light-headedness.   PHYSICAL EXAM: Vitals with BMI 05/30/2021 03/27/2021 03/10/2021  Height $Remov'6\' 1"'TlCrQc$  $Remove'6\' 1"'xDAzKAb$  -  Weight 276 lbs 8 oz 268 lbs -  BMI 48.88 91.69 -  Systolic 450 388 828  Diastolic 76 60  65  Pulse 54 73 51    CONSTITUTIONAL: Appears older than stated age, hemodynamically stable, no acute distress. SKIN: Skin is warm and dry. No rash noted. No cyanosis. No pallor. No jaundice HEAD: Normocephalic and atraumatic.  EYES: No scleral icterus MOUTH/THROAT: Moist oral membranes.  NECK:  No JVD present. No thyromegaly noted. No carotid bruits  LYMPHATIC: No visible cervical adenopathy.  CHEST Normal respiratory effort. No intercostal retractions  LUNGS: Decreased breath sounds bilaterally with rales noted, no stridor or wheezing.  CARDIOVASCULAR: Regular, positive S1-S2, no murmurs rubs or gallops appreciated. ABDOMINAL: Obese, soft, nontender, nondistended, positive bowel sounds in all 4 quadrants, no apparent ascites.  EXTREMITIES: Trace bilateral peripheral edema, 2+ bilateral dorsalis pedis pulses. HEMATOLOGIC: No significant bruising NEUROLOGIC: Oriented to person, place, and time. Nonfocal. Normal muscle tone.  PSYCHIATRIC: Normal mood and affect. Normal behavior. Cooperative  CARDIAC DATABASE: EKG: 03/09/2021: Sinus bradycardia, 56 bpm, without underlying ischemia or injury.  Echocardiogram: No results found for this or any previous visit from the past 1095 days.   Stress Testing: No results found for this or any previous visit from the past 1095 days.  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 No e/o prior MI, based on LV gram. Patient denies any chest pain at this time, but has exertional dyspnea. Currently not on anti anginal therapy. Recommend medical therapy and risk factor modification. Recommend Aspirin 81 mg, management of hypertension, hyperlipidemia, as appropriate.  LABORATORY DATA: CBC Latest Ref Rng & Units 03/09/2021 08/18/2017 04/15/2016  WBC 3.4 - 10.8 x10E3/uL 9.8 8.9 9.9  Hemoglobin 13.0 - 17.7 g/dL 16.5 15.3 16.5  Hematocrit 37.5 - 51.0 % 47.2 46.2 48.7  Platelets 150 - 450 x10E3/uL 198 167 210    CMP Latest Ref Rng & Units 03/09/2021 08/18/2017 04/15/2016  Glucose 65 - 99 mg/dL 92 124(H) 107(H)  BUN 8 - 27 mg/dL 16 18 30(H)  Creatinine 0.76 - 1.27 mg/dL 0.92 1.15  1.11  Sodium 134 - 144 mmol/L 143 138 136  Potassium 3.5 - 5.2 mmol/L 4.3 3.7 4.0  Chloride 96 - 106 mmol/L 105 103 105  CO2 20 - 29 mmol/L $RemoveB'21 27 28  'epGGRtNK$ Calcium 8.6 - 10.2 mg/dL 9.3 9.1 9.2  Total Protein 6.0 - 8.5 g/dL 7.1 - -  Total Bilirubin 0.0 - 1.2 mg/dL 0.4 - -  Alkaline Phos 44 - 121 IU/L 73 - -  AST 0 - 40 IU/L 17 - -  ALT 0 - 44 IU/L 21 - -    Lipid Panel  No results found for: CHOL, TRIG, HDL, CHOLHDL, VLDL, LDLCALC, LDLDIRECT, LABVLDL  No components found for: NTPROBNP Recent Labs    03/09/21 1524  PROBNP 122    No results for input(s): TSH in the last 8760 hours.  BMP Recent Labs    03/09/21 1524  NA 143  K 4.3  CL 105  CO2 21  GLUCOSE 92  BUN 16  CREATININE 0.92  CALCIUM 9.3     HEMOGLOBIN A1C No results found for: HGBA1C, MPG  External Labs: Collected: 11/30/2019 records provided by Primary care provider. Creatinine 1.32 mg/dL. eGFR: 57 mL/min per 1.73 m Sodium 137, potassium 4.1, chloride 103, bicarb 22, AST 17, ALT 21, alkaline phosphatase 55 Lipid profile: Total cholesterol 157, triglycerides 183, HDL 37, LDL 92 Hemoglobin A1c: 5.9 TSH: 1.14 Hemoglobin 16.8  g/dL, hematocrit 49.3%  IMPRESSION:  No diagnosis found.    RECOMMENDATIONS: CYLE KENYON is a 66 y.o. male whose past medical history and cardiac risk factors include: Nonobstructive CAD, sleep apnea not on CPAP, smoker (1ppd for 40 years), hypertension, Hx fof COVID 19 infection, obesity due to excess calorie.   Dyspnea on exertion: Improving. Medications reconciled. BNP within normal limits and overall euvolemic on physical examination. I have asked him to discuss lung cancer screening with his PCP given his long-term history of smoking. Echocardiogram will be ordered to evaluate for structural heart disease and left ventricular systolic function.  Nonobstructive coronary artery disease without angina pectoris: Noted to have nonobstructive CAD in the LAD  distribution. Continue aspirin and statin therapy. No active angina factors. Educated on the importance of secondary prevention.  Former smoker: Patient has stopped smoking since last office visit. He is congratulated on his efforts. Has at least 40-year pack history of smoking. Educated on the importance of continued complete smoking cessation.  Patient appears to be very motivated.  Sleep apnea: Patient states that he has a history of sleep apnea but currently not using his CPAP machine. Recommend reevaluation and patient is very motivated to use a CPAP if he still has underlying sleep apnea. Will refer him to [facility] for further evaluation and management.  From a cardiovascular standpoint he is stable and not having progressive symptoms.  Educated on the importance of improving his modifiable cardiovascular risk factors such as but not limited to blood pressure management, cholesterol management, increasing physical activity to 30 minutes a day 5 days a week and monitoring caloric intake to facilitate weight loss, and continued smoking cessation.  FINAL MEDICATION LIST END OF ENCOUNTER: No orders of the defined types were placed in this encounter.    There are no discontinued medications.    Current Outpatient Medications:    aspirin EC 81 MG tablet, Take 81 mg by mouth daily. Swallow whole., Disp: , Rfl:    atorvastatin (LIPITOR) 40 MG tablet, TAKE 1 TABLET(40 MG) BY MOUTH AT BEDTIME, Disp: 90 tablet, Rfl: 0   finasteride (PROSCAR) 5 MG tablet, Take 5 mg by mouth daily., Disp: , Rfl:    nitroGLYCERIN (NITROSTAT) 0.4 MG SL tablet, Place 1 tablet (0.4 mg total) under the tongue every 5 (five) minutes as needed for chest pain. If you require more than two tablets five minutes apart go to the nearest ER via EMS., Disp: 30 tablet, Rfl: 0   olmesartan-hydrochlorothiazide (BENICAR HCT) 20-12.5 MG tablet, Take 1 tablet by mouth daily., Disp: , Rfl: 4   tamsulosin  (FLOMAX) 0.4 MG CAPS capsule, Take 0.4 mg by mouth 2 (two) times daily., Disp: , Rfl: 12   triamcinolone (NASACORT) 55 MCG/ACT AERO nasal inhaler, Place 2 sprays into the nose daily., Disp: 1 each, Rfl: 12  No orders of the defined types were placed in this encounter.   There are no Patient Instructions on file for this visit.   --Continue cardiac medications as reconciled in final medication list. --No follow-ups on file. Or sooner if needed. --Continue follow-up with your primary care physician regarding the management of your other chronic comorbid conditions.  Patient's questions and concerns were addressed to his satisfaction. He voices understanding of the instructions provided during this encounter.   This note was created using a voice recognition software as a result there may be grammatical errors inadvertently enclosed that do not reflect the nature of this encounter. Every attempt is made  to correct such errors.  Rex Kras, [place], [facility]  Pager: [phone] Office: [phone]

## 2021-11-09 ENCOUNTER — Other Ambulatory Visit: Payer: Self-pay

## 2021-11-09 ENCOUNTER — Other Ambulatory Visit: Payer: Self-pay | Admitting: Cardiology

## 2021-11-09 DIAGNOSIS — R0609 Other forms of dyspnea: Secondary | ICD-10-CM

## 2021-11-09 DIAGNOSIS — I200 Unstable angina: Secondary | ICD-10-CM

## 2021-11-09 MED ORDER — NITROGLYCERIN 0.4 MG SL SUBL: SUBLINGUAL_TABLET | SUBLINGUAL | 3 refills | Status: AC

## 2021-11-20 ENCOUNTER — Other Ambulatory Visit: Payer: Self-pay

## 2021-11-20 ENCOUNTER — Encounter: Payer: Self-pay | Admitting: Cardiology

## 2021-11-20 ENCOUNTER — Ambulatory Visit: Payer: 59 | Admitting: Cardiology

## 2021-11-20 VITALS — BP 117/66 | HR 63 | Temp 98.2°F | Resp 17 | Ht 73.0 in | Wt 284.0 lb

## 2021-11-20 DIAGNOSIS — I2510 Atherosclerotic heart disease of native coronary artery without angina pectoris: Secondary | ICD-10-CM

## 2021-11-20 DIAGNOSIS — Z87891 Personal history of nicotine dependence: Secondary | ICD-10-CM

## 2021-11-20 DIAGNOSIS — R0609 Other forms of dyspnea: Secondary | ICD-10-CM

## 2021-11-20 DIAGNOSIS — I10 Essential (primary) hypertension: Secondary | ICD-10-CM

## 2021-11-20 NOTE — Progress Notes (Signed)
Date:  11/20/2021   ID:  Manuel Williams, DOB June 26, 1956, MRN [id number]  PCP:  Everardo Beals, NP  Cardiologist:  Rex Kras, DO, [facility] (established care 03/09/2021)  Date: 11/20/21 Last Office Visit: 03/27/2021  Chief Complaint  Patient presents with   Follow-up    6 MONTH   nonobstructive CAD   risk factor managment     HPI  Manuel Williams is a 66 y.o. male who presents to the office with a chief complaint of " 19-monthfollow-up for CAD management." Patient's past medical history and cardiovascular risk factors include: Nonobstructive CAD, sleep apnea not on CPAP, smoker (1ppd for 40 years), hypertension, Hx fof COVID 19 infection, obesity due to excess calorie.   He is referred to the office at the request of MEverardo Beals NP for evaluation of shortness of breath.  Patient presented to the office in June 2022 with symptoms of shortness of breath concerning for anginal equivalent and underwent left heart catheterization was found to have nonobstructive CAD in the LAD distribution.  Since then we have uptitrated his medications with regards to blood pressure, lipids, sent a referral to sleep medicine for reevaluation of sleep apnea, and encouraged healthy lifestyle and complete cessation of smoking.  Since last office visit patient has successfully stopped smoking.  His home blood pressures are well controlled.  He has unfortunately gained approximately 16 pounds since last visit which is now contributing to the shortness of breath.  He has undergone a sleep study diagnosed with severe obstructive sleep apnea and currently awaiting CPAP machine.  He is also stopped taking atorvastatin 40 mg p.o. nightly due to myalgias approximately 1 month ago.  No recent labs available for review.  Denies angina pectoris or heart failure symptoms.  No recent hospitalizations or urgent care visits for cardiovascular symptoms.  FUNCTIONAL STATUS: No structured exercise program or daily  routine.  ALLERGIES: No Known Allergies  MEDICATION LIST PRIOR TO VISIT: Current Meds  Medication Sig   acetaminophen-codeine (TYLENOL #3) 300-30 MG tablet Take 1 tablet by mouth every 6 (six) hours as needed.   amoxicillin (AMOXIL) 500 MG capsule Take 500 mg by mouth as needed.   aspirin EC 81 MG tablet Take 81 mg by mouth daily. Swallow whole.   finasteride (PROSCAR) 5 MG tablet Take 5 mg by mouth daily.   nitroGLYCERIN (NITROSTAT) 0.4 MG SL tablet PLACE ONE TABLET UNDER TONGUE AS NEEDED FOR CHEST PAIN EVERY 5 MINUTES IF YOU REQUIRE MORE THAN 2 TABLETS FIVE MINUTES APART GO TO THE NEAREST VIA   olmesartan-hydrochlorothiazide (BENICAR HCT) 20-12.5 MG tablet Take 1 tablet by mouth daily.   tamsulosin (FLOMAX) 0.4 MG CAPS capsule Take 0.4 mg by mouth 2 (two) times daily.   triamcinolone (NASACORT) 55 MCG/ACT AERO nasal inhaler Place 2 sprays into the nose daily. (Patient taking differently: Place 2 sprays into the nose as needed.)     PAST MEDICAL HISTORY: Past Medical History:  Diagnosis Date   BPH (benign prostatic hyperplasia)    Hypertension    Nonobstructive atherosclerosis of coronary artery    Prostate enlargement     PAST SURGICAL HISTORY: Past Surgical History:  Procedure Laterality Date   CYST REMOVAL NECK  1970   LEFT HEART CATH AND CORONARY ANGIOGRAPHY N/A 03/10/2021   Procedure: LEFT HEART CATH AND CORONARY ANGIOGRAPHY;  Surgeon: PNigel Mormon MD;  Location: [facility];  Service: Cardiovascular;  Laterality: N/A;    FAMILY HISTORY: The patient family history includes Colon  cancer in his father; Dementia in his mother; Stroke in his father.  SOCIAL HISTORY:  The patient  reports that he quit smoking about 8 months ago. His smoking use included cigarettes. He smoked an average of 1 pack per day. He quit smokeless tobacco use about 53 years ago.  His smokeless tobacco use included chew. He reports current alcohol use of about 1.0 standard drink per week.  He reports that he does not use drugs.  REVIEW OF SYSTEMS: Review of Systems  Constitutional: Positive for weight gain. Negative for chills, fever and malaise/fatigue.  HENT:  Negative for hoarse voice and nosebleeds.   Eyes:  Negative for discharge, double vision and pain.  Cardiovascular:  Negative for chest pain, claudication, dyspnea on exertion, leg swelling, near-syncope, orthopnea, palpitations, paroxysmal nocturnal dyspnea and syncope.  Respiratory:  Positive for shortness of breath (chronic). Negative for hemoptysis.   Musculoskeletal:  Negative for muscle cramps and myalgias.  Gastrointestinal:  Negative for abdominal pain, constipation, diarrhea, hematemesis, hematochezia, melena, nausea and vomiting.  Neurological:  Negative for dizziness and light-headedness.   PHYSICAL EXAM: Vitals with BMI 11/20/2021 05/30/2021 03/27/2021  Height _0  _1  _2   Weight 284 lbs 276 lbs 8 oz 268 lbs  BMI 37.48 17.61 60.73  Systolic 710 626 948  Diastolic 66 76 60  Pulse 63 54 73    CONSTITUTIONAL: Appears older than stated age, hemodynamically stable, no acute distress. SKIN: Skin is warm and dry. No rash noted. No cyanosis. No pallor. No jaundice HEAD: Normocephalic and atraumatic.  EYES: No scleral icterus MOUTH/THROAT: Moist oral membranes.  NECK: No JVD present. No thyromegaly noted. No carotid bruits  LYMPHATIC: No visible cervical adenopathy.  CHEST Normal respiratory effort. No intercostal retractions  LUNGS: Decreased breath sounds bilaterally with rales noted, no stridor or wheezing.  CARDIOVASCULAR: Regular, positive S1-S2, no murmurs rubs or gallops appreciated. ABDOMINAL: Obese, soft, nontender, nondistended, positive bowel sounds in all 4 quadrants, no apparent ascites.  EXTREMITIES: Trace bilateral peripheral edema, 2+ bilateral dorsalis pedis pulses. HEMATOLOGIC: No significant bruising NEUROLOGIC: Oriented to person, place, and time. Nonfocal. Normal muscle tone.   PSYCHIATRIC: Normal mood and affect. Normal behavior. Cooperative  CARDIAC DATABASE: EKG: 11/20/2021: NSR, 63bpm, nonspecific T wave changes, without underlying injury pattern.   Echocardiogram: 03/29/2021: Normal LV systolic function with EF 69%. Left ventricle cavity is normal in size. Normal left ventricular wall thickness. Normal global wall motion. Doppler evidence of grade II (pseudonormal) diastolic dysfunction, elevated LAP. Calculated EF 69%.   Stress Testing: No results found for this or any previous visit from the past 1095 days.  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 No e/o prior MI, based on LV gram. Patient denies any chest pain at this time, but has exertional dyspnea. Currently not on anti anginal therapy. Recommend medical therapy and risk factor modification. Recommend Aspirin 81 mg, management of hypertension, hyperlipidemia, as appropriate.  LABORATORY DATA: CBC Latest Ref Rng & Units 03/09/2021 08/18/2017 04/15/2016  WBC 3.4 - 10.8 x10E3/uL 9.8 8.9 9.9  Hemoglobin 13.0 - 17.7 g/dL 16.5 15.3 16.5  Hematocrit 37.5 - 51.0 % 47.2 46.2 48.7  Platelets 150 - 450 x10E3/uL 198 167 210    CMP Latest Ref Rng & Units 03/09/2021 08/18/2017 04/15/2016  Glucose 65 - 99 mg/dL 92 124(H) 107(H)  BUN 8 - 27 mg/dL  16 18 30(H)  Creatinine 0.76 - 1.27 mg/dL 0.92 1.15 1.11  Sodium 134 - 144 mmol/L 143 138 136  Potassium 3.5 - 5.2 mmol/L 4.3 3.7 4.0  Chloride 96 - 106 mmol/L 105 103 105  CO2 20 - 29 mmol/L _0 Calcium 8.6 - 10.2 mg/dL 9.3 9.1 9.2  Total Protein 6.0 - 8.5 g/dL 7.1 - -  Total Bilirubin 0.0 - 1.2 mg/dL 0.4 - -  Alkaline Phos 44 - 121 IU/L 73 - -  AST 0 - 40 IU/L 17 - -  ALT 0 - 44 IU/L 21 - -    Lipid Panel  No results found for: CHOL, TRIG, HDL, CHOLHDL, VLDL, LDLCALC,  LDLDIRECT, LABVLDL  No components found for: NTPROBNP Recent Labs    03/09/21 1524  PROBNP 122   No results for input(s): TSH in the last 8760 hours.  BMP Recent Labs    03/09/21 1524  NA 143  K 4.3  CL 105  CO2 21  GLUCOSE 92  BUN 16  CREATININE 0.92  CALCIUM 9.3    HEMOGLOBIN A1C No results found for: HGBA1C, MPG  External Labs: Collected: 11/30/2019 records provided by Primary care provider. Creatinine 1.32 mg/dL. eGFR: 57 mL/min per 1.73 m Sodium 137, potassium 4.1, chloride 103, bicarb 22, AST 17, ALT 21, alkaline phosphatase 55 Lipid profile: Total cholesterol 157, triglycerides 183, HDL 37, LDL 92 Hemoglobin A1c: 5.9 TSH: 1.14 Hemoglobin 16.8 g/dL, hematocrit 49.3%  IMPRESSION:    ICD-10-CM   1. Nonobstructive atherosclerosis of coronary artery  I25.10 Lipid Panel With LDL/HDL Ratio    LDL cholesterol, direct    CMP14+EGFR    Lipoprotein A (LPA)    Apo A1 + B + Ratio    Pro b natriuretic peptide (BNP)    2. Dyspnea on exertion  R06.09     3. Benign hypertension  I10 EKG 12-Lead    4. Former smoker  Z87.891         RECOMMENDATIONS: Manuel Williams is a 66 y.o. male whose past medical history and cardiac risk factors include: Nonobstructive CAD, sleep apnea not on CPAP, former smoker (1ppd for 40 years), hypertension, Hx fof COVID 19 infection, obesity due to excess calorie.   Nonobstructive atherosclerosis of coronary artery Denies angina pectoris. EKG: Sinus rhythm without underlying injury pattern.  Echo: Preserved LVEF, grade 2 diastolic dysfunction. Was on atorvastatin 40 mg p.o. nightly which she discontinued a month ago due to myalgias. Continue aspirin, ARB.  No use of sublingual nitroglycerin tablets. Has successfully stopped smoking June 2022 Has been evaluated for sleep apnea since last office visit -per report severe OSA awaiting CPAP machine No recent labs available for review. We will recheck fasting lipid profile, CMP, NT  proBNP Educated importance of secondary prevention.  Dyspnea on exertion Multifactorial. Educated importance of weight loss. May also benefit from pulm evaluation given his strong history of smoking. Echocardiogram notes preserved LVEF, grade 2 diastolic dysfunction, however on clinical examination appears to be euvolemic.  We will check NT proBNP for further evaluation. Further recommendations to follow.  Benign hypertension Office blood pressures are very well controlled. Medications reconciled.  Former smoker At least 40-year pack history of smoking. Quit in June 2022. Educated on importance of complete smoking cessation. We will still benefit from pulmonary evaluation given such a prolonged history of smoking and dyspnea. Congratulated on being smoke-free since last office visit.  FINAL MEDICATION LIST END OF ENCOUNTER: No orders of the defined types  were placed in this encounter.     Medications Discontinued During This Encounter  Medication Reason   atorvastatin (LIPITOR) 40 MG tablet    cephALEXin (KEFLEX) 500 MG capsule       Current Outpatient Medications:    acetaminophen-codeine (TYLENOL #3) 300-30 MG tablet, Take 1 tablet by mouth every 6 (six) hours as needed., Disp: , Rfl:    amoxicillin (AMOXIL) 500 MG capsule, Take 500 mg by mouth as needed., Disp: , Rfl:    aspirin EC 81 MG tablet, Take 81 mg by mouth daily. Swallow whole., Disp: , Rfl:    finasteride (PROSCAR) 5 MG tablet, Take 5 mg by mouth daily., Disp: , Rfl:    nitroGLYCERIN (NITROSTAT) 0.4 MG SL tablet, PLACE ONE TABLET UNDER TONGUE AS NEEDED FOR CHEST PAIN EVERY 5 MINUTES IF YOU REQUIRE MORE THAN 2 TABLETS FIVE MINUTES APART GO TO THE NEAREST VIA, Disp: 25 tablet, Rfl: 3   olmesartan-hydrochlorothiazide (BENICAR HCT) 20-12.5 MG tablet, Take 1 tablet by mouth daily., Disp: , Rfl: 4   tamsulosin (FLOMAX) 0.4 MG CAPS capsule, Take 0.4 mg by mouth 2 (two) times daily., Disp: , Rfl: 12   triamcinolone  (NASACORT) 55 MCG/ACT AERO nasal inhaler, Place 2 sprays into the nose daily. (Patient taking differently: Place 2 sprays into the nose as needed.), Disp: 1 each, Rfl: 12  Orders Placed This Encounter  Procedures   Lipid Panel With LDL/HDL Ratio   LDL cholesterol, direct   CMP14+EGFR   Lipoprotein A (LPA)   Apo A1 + B + Ratio   Pro b natriuretic peptide (BNP)   EKG 12-Lead     There are no Patient Instructions on file for this visit.   --Continue cardiac medications as reconciled in final medication list. --No follow-ups on file. Or sooner if needed. --Continue follow-up with your primary care physician regarding the management of your other chronic comorbid conditions.  Patient's questions and concerns were addressed to his satisfaction. He voices understanding of the instructions provided during this encounter.   This note was created using a voice recognition software as a result there may be grammatical errors inadvertently enclosed that do not reflect the nature of this encounter. Every attempt is made to correct such errors.  Rex Kras, [place], [facility]  Pager: [phone] Office: [phone]

## 2021-11-25 ENCOUNTER — Emergency Department (HOSPITAL_COMMUNITY)
Admission: EM | Admit: 2021-11-25 | Discharge: 2021-11-25 | Disposition: A | Discharge status: Home / Self Care | Payer: 59 | Attending: Emergency Medicine | Admitting: Emergency Medicine

## 2021-11-25 ENCOUNTER — Emergency Department (HOSPITAL_COMMUNITY): Payer: 59

## 2021-11-25 ENCOUNTER — Encounter (HOSPITAL_COMMUNITY): Payer: Self-pay

## 2021-11-25 ENCOUNTER — Other Ambulatory Visit: Payer: Self-pay

## 2021-11-25 DIAGNOSIS — M545 Low back pain, unspecified: Secondary | ICD-10-CM | POA: Insufficient documentation

## 2021-11-25 DIAGNOSIS — R109 Unspecified abdominal pain: Secondary | ICD-10-CM | POA: Insufficient documentation

## 2021-11-25 DIAGNOSIS — Z7982 Long term (current) use of aspirin: Secondary | ICD-10-CM | POA: Insufficient documentation

## 2021-11-25 DIAGNOSIS — Z79899 Other long term (current) drug therapy: Secondary | ICD-10-CM | POA: Diagnosis not present

## 2021-11-25 LAB — URINALYSIS, ROUTINE W REFLEX MICROSCOPIC
Bilirubin Urine: NEGATIVE
Glucose, UA: NEGATIVE mg/dL
Hgb urine dipstick: NEGATIVE
Ketones, ur: NEGATIVE mg/dL
Leukocytes,Ua: NEGATIVE
Nitrite: NEGATIVE
Protein, ur: NEGATIVE mg/dL
Specific Gravity, Urine: 1.021 (ref 1.005–1.030)
pH: 5 (ref 5.0–8.0)

## 2021-11-25 LAB — CBC WITH DIFFERENTIAL/PLATELET
Abs Immature Granulocytes: 0.05 10*3/uL (ref 0.00–0.07)
Basophils Absolute: 0.1 10*3/uL (ref 0.0–0.1)
Basophils Relative: 1 %
Eosinophils Absolute: 0.5 10*3/uL (ref 0.0–0.5)
Eosinophils Relative: 5 %
HCT: 50.5 % (ref 39.0–52.0)
Hemoglobin: 17 g/dL (ref 13.0–17.0)
Immature Granulocytes: 1 %
Lymphocytes Relative: 28 %
Lymphs Abs: 2.6 10*3/uL (ref 0.7–4.0)
MCH: 31.8 pg (ref 26.0–34.0)
MCHC: 33.7 g/dL (ref 30.0–36.0)
MCV: 94.6 fL (ref 80.0–100.0)
Monocytes Absolute: 0.8 10*3/uL (ref 0.1–1.0)
Monocytes Relative: 9 %
Neutro Abs: 5.1 10*3/uL (ref 1.7–7.7)
Neutrophils Relative %: 56 %
Platelets: 189 10*3/uL (ref 150–400)
RBC: 5.34 MIL/uL (ref 4.22–5.81)
RDW: 12.8 % (ref 11.5–15.5)
WBC: 9 10*3/uL (ref 4.0–10.5)
nRBC: 0 % (ref 0.0–0.2)

## 2021-11-25 LAB — BASIC METABOLIC PANEL
Anion gap: 8 (ref 5–15)
BUN: 26 mg/dL — ABNORMAL HIGH (ref 8–23)
CO2: 28 mmol/L (ref 22–32)
Calcium: 9.2 mg/dL (ref 8.9–10.3)
Chloride: 102 mmol/L (ref 98–111)
Creatinine, Ser: 1.08 mg/dL (ref 0.61–1.24)
GFR, Estimated: >60 mL/min (ref >60)
Glucose, Bld: 128 mg/dL — ABNORMAL HIGH (ref 70–99)
Potassium: 4.1 mmol/L (ref 3.5–5.1)
Sodium: 138 mmol/L (ref 135–145)

## 2021-11-25 MED ORDER — FENTANYL CITRATE PF 50 MCG/ML IJ SOSY
50.0000 ug | PREFILLED_SYRINGE | Freq: Once | INTRAMUSCULAR | Status: AC
  Administered 2021-11-25: 50 ug via INTRAVENOUS
  Filled 2021-11-25: qty 1

## 2021-11-25 MED ORDER — METHOCARBAMOL 500 MG PO TABS: 500.0000 mg | ORAL_TABLET | Freq: Two times a day (BID) | ORAL | 0 refills | Status: AC | PRN

## 2021-11-25 MED ORDER — KETOROLAC TROMETHAMINE 30 MG/ML IJ SOLN
15.0000 mg | Freq: Once | INTRAMUSCULAR | Status: AC
  Administered 2021-11-25: 15 mg via INTRAVENOUS
  Filled 2021-11-25: qty 1

## 2021-11-25 MED ORDER — LIDOCAINE 5 % EX PTCH: 1.0000 | MEDICATED_PATCH | CUTANEOUS | 0 refills | Status: AC

## 2021-11-25 NOTE — Discharge Instructions (Signed)
You were seen in the emergency department for some right-sided low back pain.  You had blood work urinalysis and a CAT scan of your abdomen and pelvis that did not show any obvious explanation for your back pain.  This is likely muscular.  You can use over-the-counter ibuprofen and we are prescribing you some topical pain patches and a muscle relaxant.  Please do not drive if using the muscle relaxants.  Contact your primary care doctor for close follow-up.

## 2021-11-25 NOTE — ED Provider Notes (Signed)
[facility] EMERGENCY DEPARTMENT Provider Note   CSN: [id number] Arrival date & time: 11/25/21  1926     History  Chief Complaint  Patient presents with   Flank Pain    Right side    Manuel Williams is a 66 y.o. male.  He is presenting with right-sided low back pain 8 out of 10 intensity that started this morning.  Worse with bending twisting movement.  Denies any trauma.  No abdominal pain.  No fevers chills nausea vomiting.  No urinary symptoms.  No numbness or weakness bowel or bladder incontinence.  Denies any prior back symptoms.  The history is provided by the patient.  Flank Pain This is a new problem. The current episode started 6 to 12 hours ago. The problem occurs constantly. The problem has not changed since onset.Pertinent negatives include no chest pain, no abdominal pain, no headaches and no shortness of breath. The symptoms are aggravated by bending, twisting and walking. Nothing relieves the symptoms. He has tried nothing for the symptoms. The treatment provided no relief.      Home Medications Prior to Admission medications   Medication Sig Start Date End Date Taking? Authorizing Provider  acetaminophen-codeine (TYLENOL #3) 300-30 MG tablet Take 1 tablet by mouth every 6 (six) hours as needed. 08/27/21   [provider]  amoxicillin (AMOXIL) 500 MG capsule Take 500 mg by mouth as needed. 10/23/21   [provider]  aspirin EC 81 MG tablet Take 81 mg by mouth daily. Swallow whole.    [provider]  finasteride (PROSCAR) 5 MG tablet Take 5 mg by mouth daily.    [provider]  nitroGLYCERIN (NITROSTAT) 0.4 MG SL tablet PLACE ONE TABLET UNDER TONGUE AS NEEDED FOR CHEST PAIN EVERY 5 MINUTES IF YOU REQUIRE MORE THAN 2 TABLETS FIVE MINUTES APART GO TO THE NEAREST VIA 11/09/21   Tolia, Sunit, DO  olmesartan-hydrochlorothiazide (BENICAR HCT) 20-12.5 MG tablet Take 1 tablet by mouth daily. 04/04/16   [provider]  tamsulosin  (FLOMAX) 0.4 MG CAPS capsule Take 0.4 mg by mouth 2 (two) times daily. 04/04/16   [provider]  triamcinolone (NASACORT) 55 MCG/ACT AERO nasal inhaler Place 2 sprays into the nose daily. Patient taking differently: Place 2 sprays into the nose as needed. 05/30/21   Dohmeier, Asencion Partridge, MD      Allergies    Patient has no known allergies.    Review of Systems   Review of Systems  Constitutional:  Negative for fever.  Respiratory:  Negative for shortness of breath.   Cardiovascular:  Negative for chest pain.  Gastrointestinal:  Negative for abdominal pain.  Genitourinary:  Positive for flank pain. Negative for dysuria.  Musculoskeletal:  Positive for back pain.  Skin:  Negative for rash.  Neurological:  Negative for weakness, numbness and headaches.   Physical Exam Updated Vital Signs BP 140/60 (BP Location: Right Arm)    Pulse (!) 55    Temp 97.6 F (36.4 C) (Oral)    Resp 18    SpO2 96%  Physical Exam Vitals and nursing note reviewed.  Constitutional:      General: He is not in acute distress.    Appearance: Normal appearance. He is well-developed.  HENT:     Head: Normocephalic and atraumatic.  Eyes:     Conjunctiva/sclera: Conjunctivae normal.  Cardiovascular:     Rate and Rhythm: Normal rate and regular rhythm.     Heart sounds: No murmur heard. Pulmonary:  Effort: Pulmonary effort is normal. No respiratory distress.     Breath sounds: Normal breath sounds.  Abdominal:     Palpations: Abdomen is soft.     Tenderness: There is no abdominal tenderness. There is no right CVA tenderness, left CVA tenderness, guarding or rebound.  Musculoskeletal:        General: No swelling.     Cervical back: Neck supple.  Skin:    General: Skin is warm and dry.     Capillary Refill: Capillary refill takes less than 2 seconds.  Neurological:     General: No focal deficit present.     Mental Status: He is alert.     Sensory: No sensory deficit.     Motor: No weakness.      Gait: Gait normal.    ED Results / Procedures / Treatments   Labs (all labs ordered are listed, but only abnormal results are displayed) Labs Reviewed  BASIC METABOLIC PANEL - Abnormal; Notable for the following components:      Result Value   Glucose, Bld 128 (*)    BUN 26 (*)    All other components within normal limits  CBC WITH DIFFERENTIAL/PLATELET  URINALYSIS, ROUTINE W REFLEX MICROSCOPIC    EKG None  Radiology CT Renal Stone Study  Result Date: 11/25/2021 CLINICAL DATA:  Flank pain, kidney stone suspected. Increasing over the past 3 days. EXAM: CT ABDOMEN AND PELVIS WITHOUT CONTRAST TECHNIQUE: Multidetector CT imaging of the abdomen and pelvis was performed following the standard protocol without IV contrast. RADIATION DOSE REDUCTION: This exam was performed according to the departmental dose-optimization program which includes automated exposure control, adjustment of the mA and/or kV according to patient size and/or use of iterative reconstruction technique. COMPARISON:  CT without and with contrast 09/26/2017. FINDINGS: Lower chest: No acute abnormality. There is posterior atelectasis in the lung bases. The heart is slightly enlarged, with lipomatous infiltration of the inter-atrial septum and scattered three-vessel calcific CAD. Hepatobiliary: 20 cm length mildly steatotic liver with no focal abnormality without contrast. Gallbladder and bile ducts are unremarkable. Pancreas: Unremarkable. Spleen: Normal in size and in noncontrast attenuation. Adrenals/Urinary Tract: There is no adrenal mass. Stable slight nodular thickening both adrenal glands. Stable small cyst anterior lower right kidney with no other focal abnormality in the unenhanced renal cortex. There are no intrarenal stones. Both ureters are decompressed and clear. There is no intravesical stone. There is mild general thickening of the bladder without masslike thickening. Stomach/Bowel: No dilatation or wall thickening  including the appendix. Colonic diverticula and mild constipation. No evidence of colitis or diverticulitis. 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 No enlarged abdominal or pelvic lymph nodes. No AAA. Reproductive: Enlarged prostate 4.6 cm transverse with central zone calcifications. Other: Umbilical and bilateral inguinal fat hernias are again noted. There is no free air, hemorrhage or fluid. No incarcerated hernia. Musculoskeletal: Mild osteopenia and degenerative change lumbar spine. No worrisome regional bone lesion. IMPRESSION: 1. No acute findings to suggest an explanation for the patient's symptoms. There is no evidence of urinary stones or obstruction, no focal gallbladder abnormality, and a normal appendix is well visible. 2. Mildly enlarged prostate with bladder base impression. There is mild general thickening of the bladder which is probably due to hypertrophy, less likely cystitis. Correlate with urinalysis. 3. Mildly prominent liver with mild steatosis. 4. Constipation and diverticulosis. 5. 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 6. Umbilical and inguinal fat hernias. 7. Aortic and coronary artery atherosclerosis. Electronically Signed   By: Telford Nab M.D.   On: 11/25/2021 22:17    Procedures Procedures    Medications Ordered in ED Medications  fentaNYL (SUBLIMAZE) injection 50 mcg (has no administration in time range)  ketorolac (TORADOL) 30 MG/ML injection 15 mg (has no administration in time range)    ED Course/ Medical Decision Making/ A&P Clinical Course as of 11/26/21 4174  Sat Nov 25, 2021  2220 CT interpreted by me as no acute ureteral stone or hydronephrosis.  Awaiting radiology reading. [MB]    Clinical Course User Index [MB] Hayden Rasmussen, MD                            Medical Decision Making Amount and/or Complexity of Data Reviewed Labs: ordered. Radiology: ordered.  Risk Prescription drug management.  This patient complains of right-sided low back pain; this involves an extensive number of treatment Options and is a complaint that carries with it a high risk of complications and morbidity. The differential includes musculoskeletal pain, radiculopathy, renal colic, pyelonephritis, zoster  I ordered, reviewed and interpreted labs, which included CBC with normal white count normal hemoglobin, chemistries normal other than mildly elevated glucose, urinalysis without signs of infection, no hematuria I ordered medication IV pain medication and reviewed PMP when indicated. I ordered imaging studies which included CT renal and I independently    visualized and interpreted imaging which showed no acute findings  Previous records obtained and reviewed in epic including recent cardiology notes  Social determinants considered, no significant barriers  After the interventions stated above, I reevaluated the patient and found patient's pain to be improved.  He has no pain at rest and only when he is trying to get himself up from lying to sitting. Admission and further testing considered, no indications for admission or further work-up at this time.  Recommended symptomatic treatment at home and will cover with muscle relaxant and Lidoderm patch.  Recommended close follow-up with PCP.  Return instructions discussed          Final Clinical Impression(s) / ED Diagnoses Final diagnoses:  Acute right-sided low back pain without sciatica    Rx / DC Orders ED Discharge Orders          Ordered    methocarbamol (ROBAXIN) 500 MG tablet  2 times daily PRN        11/25/21 2225    lidocaine (LIDODERM) 5 %  Every 24 hours        11/25/21 2225              Hayden Rasmussen, MD 11/26/21 [phone]

## 2021-11-25 NOTE — ED Triage Notes (Signed)
Reports right flank pain that started this morning. Took codeine with little relief.

## 2021-12-08 ENCOUNTER — Other Ambulatory Visit: Payer: Self-pay

## 2021-12-08 DIAGNOSIS — I2510 Atherosclerotic heart disease of native coronary artery without angina pectoris: Secondary | ICD-10-CM

## 2022-01-25 ENCOUNTER — Ambulatory Visit: Payer: 59 | Admitting: Neurology

## 2022-05-21 ENCOUNTER — Ambulatory Visit: Payer: 59 | Admitting: Cardiology

## 2022-07-02 ENCOUNTER — Other Ambulatory Visit: Payer: Self-pay | Admitting: Cardiology

## 2022-07-02 DIAGNOSIS — I200 Unstable angina: Secondary | ICD-10-CM

## 2022-07-02 DIAGNOSIS — R0609 Other forms of dyspnea: Secondary | ICD-10-CM

## 2023-09-08 IMAGING — CT CT RENAL STONE PROTOCOL
2 of 4 series · 15 of 46 positions shown, 17 images · non-contrast
Comparison: CT without and with contrast 09/26/2017.

CLINICAL DATA: Flank pain, kidney stone suspected. Increasing over
the past 3 days.



[Series 2: axial st · axial · 0.90mm/px · z∈[+1458,+1948]mm · 12 of 110 slices shown, 14 images]
[im 6/110  soft-tissue]
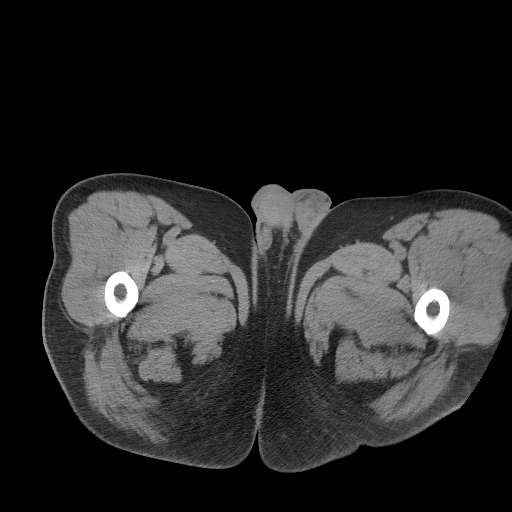
[im 6/110  bone]
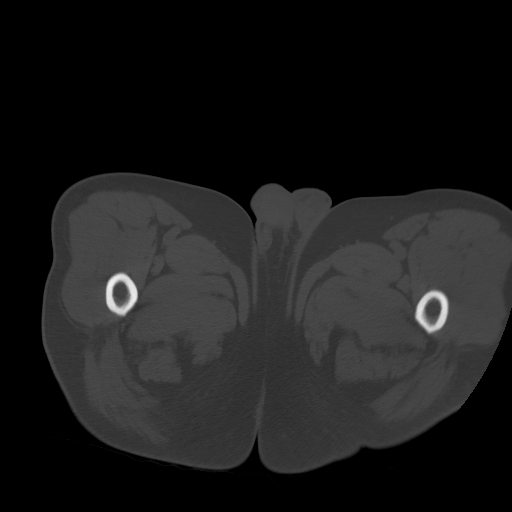
[im 18/110  soft-tissue]
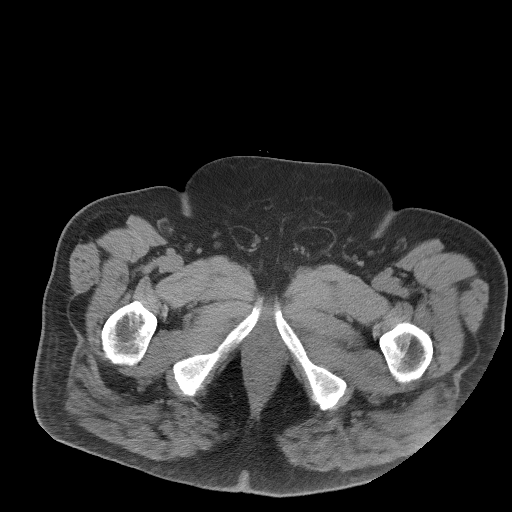
[im 23/110  soft-tissue]
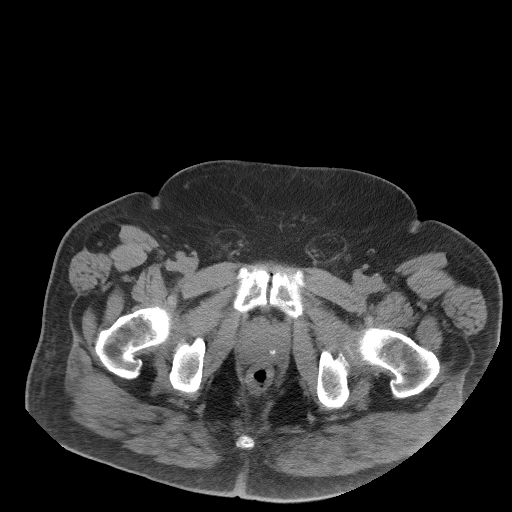
[im 35/110  soft-tissue]
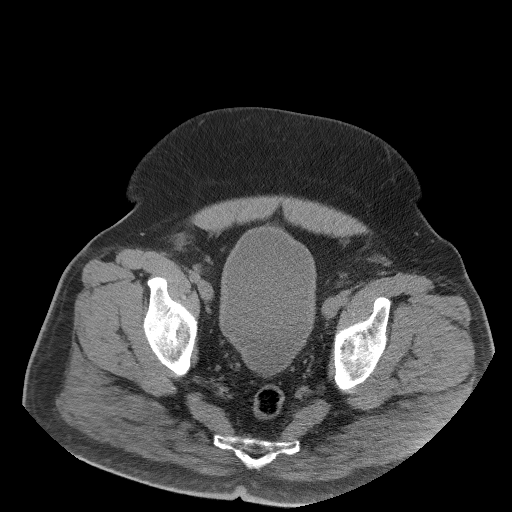
[im 41/110  soft-tissue]
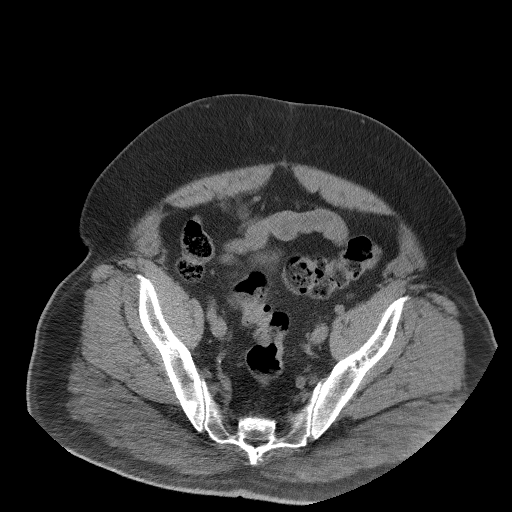
[im 52/110  soft-tissue]
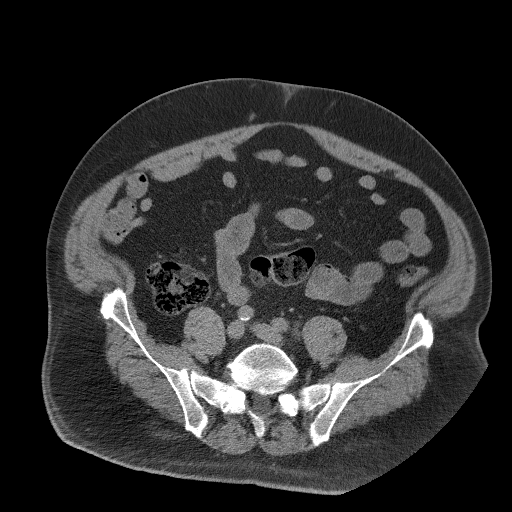
[im 58/110  soft-tissue]
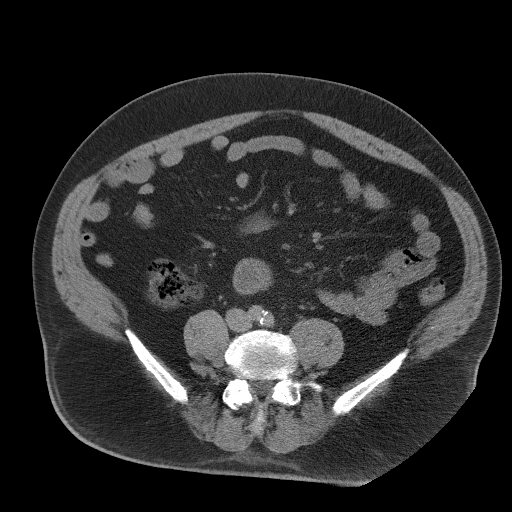
[im 69/110  soft-tissue]
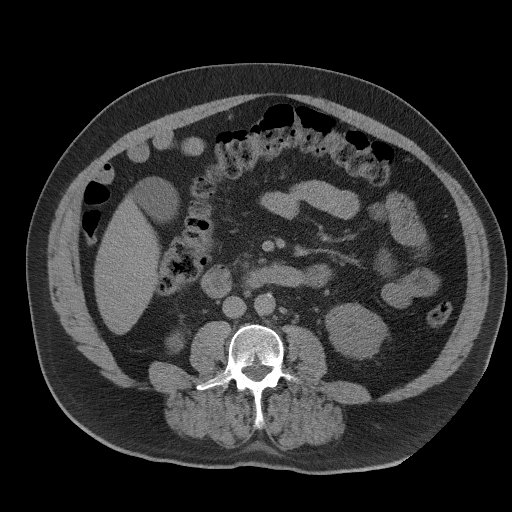
[im 75/110  soft-tissue]
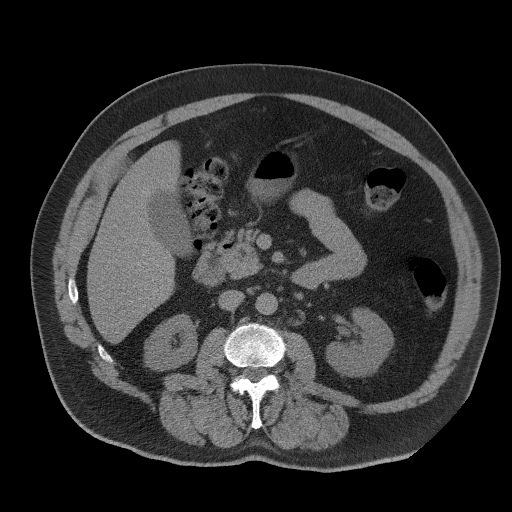
[im 75/110  bone]
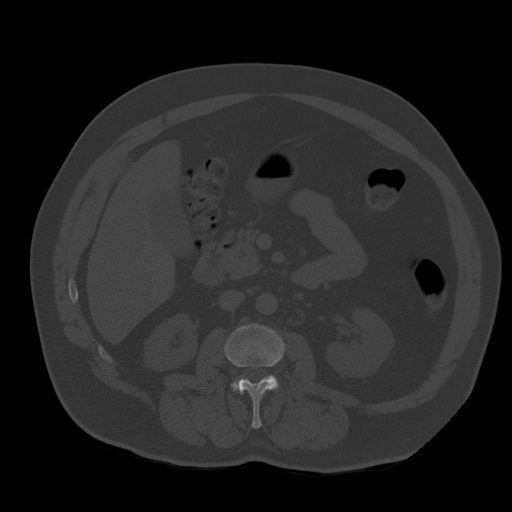
[im 87/110  soft-tissue]
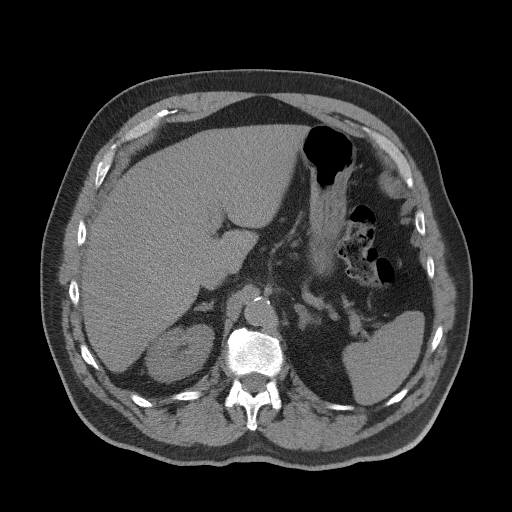
[im 92/110  soft-tissue]
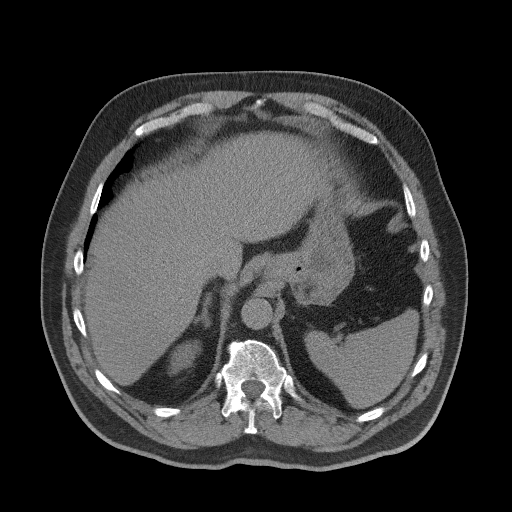
[im 104/110  soft-tissue]
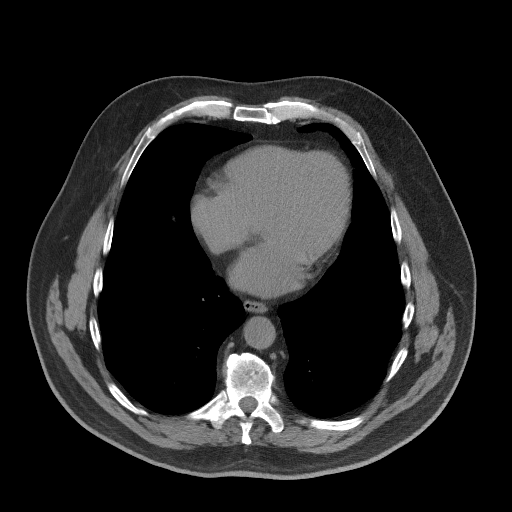

[Series 5: coronal st · coronal · 0.92mm/px · 3 of 134 slices shown]
[im 45/134  soft-tissue]
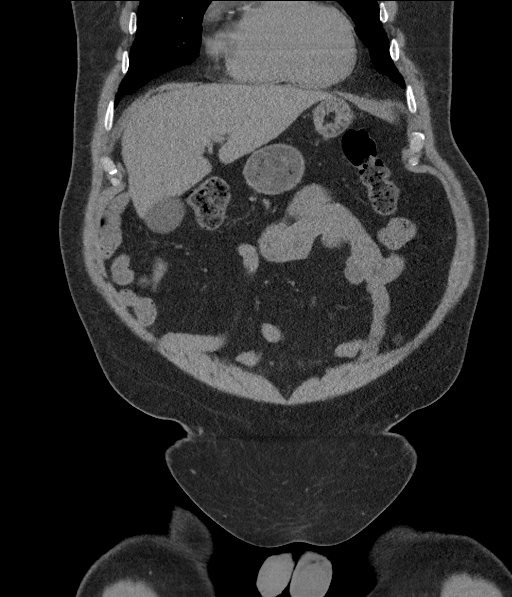
[im 60/134  soft-tissue]
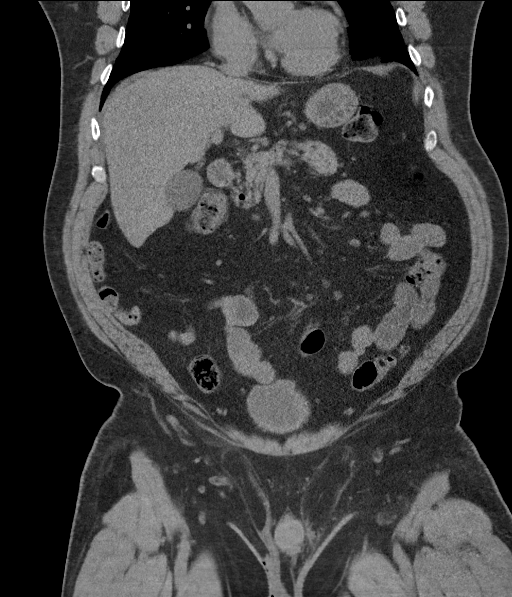
[im 74/134  soft-tissue]
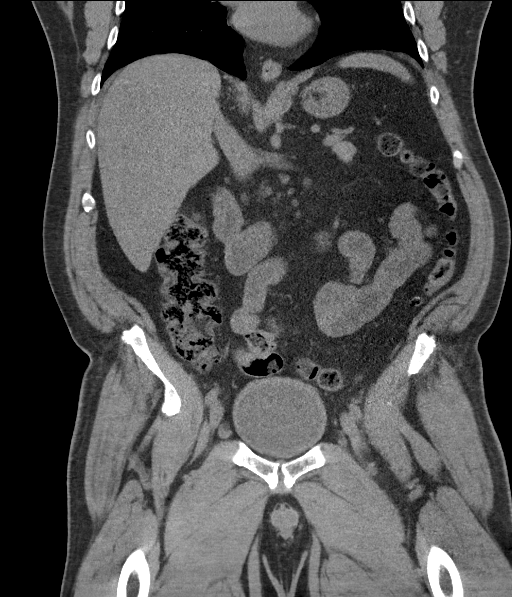

[15 of 46 positions shown; findings below may reference images not displayed]

FINDINGS: Lower chest: No acute abnormality. There is posterior atelectasis in
the lung bases. The heart is slightly enlarged, with lipomatous
infiltration of the inter-atrial septum and scattered three-vessel
calcific CAD.

Hepatobiliary: 20 cm length mildly steatotic liver with no focal
abnormality without contrast. Gallbladder and bile ducts are
unremarkable.

Pancreas: Unremarkable.

Spleen: Normal in size and in noncontrast attenuation.

Adrenals/Urinary Tract: There is no adrenal mass. Stable slight
nodular thickening both adrenal glands. Stable small cyst anterior
lower right kidney with no other focal abnormality in the unenhanced
renal cortex.

There are no intrarenal stones. Both ureters are decompressed and
clear.

There is no intravesical stone. There is mild general thickening of
the bladder without masslike thickening.

Stomach/Bowel: No dilatation or wall thickening including the
appendix. Colonic diverticula and mild constipation. No evidence of
colitis or diverticulitis.

Again noted is a nonobstructive transmesenteric internal hernia of
the ascending mesocolon with small-bowel extending out laterally to
the ascending segment

Vascular/Lymphatic: Aortic atherosclerosis. No enlarged abdominal or
pelvic lymph nodes. No AAA.

Reproductive: Enlarged prostate 4.6 cm transverse with central zone
calcifications.

Other: Umbilical and bilateral inguinal fat hernias are again noted.
There is no free air, hemorrhage or fluid. No incarcerated hernia.

Musculoskeletal: Mild osteopenia and degenerative change lumbar
spine. No worrisome regional bone lesion.
IMPRESSION: 1. No acute findings to suggest an explanation for the patient's
symptoms. There is no evidence of urinary stones or obstruction, no
focal gallbladder abnormality, and a normal appendix is well
visible.
2. Mildly enlarged prostate with bladder base impression. There is
mild general thickening of the bladder which is probably due to
hypertrophy, less likely cystitis. Correlate with urinalysis.
3. Mildly prominent liver with mild steatosis.
4. Constipation and diverticulosis.
5. Nonobstructive transmesenteric internal hernia ascending
mesocolon.
6. Umbilical and inguinal fat hernias.
7. Aortic and coronary artery atherosclerosis.
